# Patient Record
Sex: Male | Born: 1960 | Race: Black or African American | Hispanic: No | Smoking: Former smoker
Health system: Southern US, Community
[De-identification: ages and names within clinical notes are randomized; demographics above are authoritative.]

## PROBLEM LIST (undated history)

## (undated) DIAGNOSIS — R945 Abnormal results of liver function studies: Secondary | ICD-10-CM

## (undated) DIAGNOSIS — I10 Essential (primary) hypertension: Secondary | ICD-10-CM

## (undated) DIAGNOSIS — R3129 Other microscopic hematuria: Secondary | ICD-10-CM

## (undated) DIAGNOSIS — K760 Fatty (change of) liver, not elsewhere classified: Secondary | ICD-10-CM

## (undated) DIAGNOSIS — K219 Gastro-esophageal reflux disease without esophagitis: Secondary | ICD-10-CM

## (undated) DIAGNOSIS — E669 Obesity, unspecified: Secondary | ICD-10-CM

## (undated) DIAGNOSIS — E785 Hyperlipidemia, unspecified: Secondary | ICD-10-CM

## (undated) DIAGNOSIS — E119 Type 2 diabetes mellitus without complications: Secondary | ICD-10-CM

## (undated) HISTORY — DX: Essential (primary) hypertension: I10

## (undated) HISTORY — DX: Gastro-esophageal reflux disease without esophagitis: K21.9

## (undated) HISTORY — DX: Other microscopic hematuria: R31.29

## (undated) HISTORY — DX: Fatty (change of) liver, not elsewhere classified: K76.0

## (undated) HISTORY — DX: Hyperlipidemia, unspecified: E78.5

## (undated) HISTORY — PX: OTHER SURGICAL HISTORY: SHX169

## (undated) HISTORY — DX: Abnormal results of liver function studies: R94.5

## (undated) HISTORY — DX: Obesity, unspecified: E66.9

## (undated) HISTORY — DX: Type 2 diabetes mellitus without complications: E11.9

---

## 2004-07-28 ENCOUNTER — Encounter: Admission: RE | Admit: 2004-07-28 | Discharge: 2004-07-28 | Payer: Self-pay | Admitting: Internal Medicine

## 2005-09-08 ENCOUNTER — Encounter: Admission: RE | Admit: 2005-09-08 | Discharge: 2005-09-08 | Payer: Self-pay | Admitting: Internal Medicine

## 2006-08-20 ENCOUNTER — Encounter
Admission: RE | Admit: 2006-08-20 | Discharge: 2006-08-20 | Payer: Self-pay | Admitting: Physical Medicine and Rehabilitation

## 2007-01-10 DIAGNOSIS — R7989 Other specified abnormal findings of blood chemistry: Secondary | ICD-10-CM

## 2007-01-10 DIAGNOSIS — K760 Fatty (change of) liver, not elsewhere classified: Secondary | ICD-10-CM

## 2007-01-10 HISTORY — DX: Fatty (change of) liver, not elsewhere classified: K76.0

## 2007-01-10 HISTORY — DX: Other specified abnormal findings of blood chemistry: R79.89

## 2007-04-11 ENCOUNTER — Encounter: Admission: RE | Admit: 2007-04-11 | Discharge: 2007-04-11 | Payer: Self-pay | Admitting: Internal Medicine

## 2010-04-28 ENCOUNTER — Ambulatory Visit (HOSPITAL_COMMUNITY)
Admission: RE | Admit: 2010-04-28 | Discharge: 2010-04-28 | Disposition: A | Discharge status: Home / Self Care | Payer: 59 | Source: Ambulatory Visit | Attending: Gastroenterology | Admitting: Gastroenterology

## 2010-04-28 ENCOUNTER — Other Ambulatory Visit: Payer: Self-pay | Admitting: Gastroenterology

## 2010-04-28 DIAGNOSIS — E785 Hyperlipidemia, unspecified: Secondary | ICD-10-CM | POA: Insufficient documentation

## 2010-04-28 DIAGNOSIS — Z1211 Encounter for screening for malignant neoplasm of colon: Secondary | ICD-10-CM | POA: Insufficient documentation

## 2010-04-28 DIAGNOSIS — K219 Gastro-esophageal reflux disease without esophagitis: Secondary | ICD-10-CM | POA: Insufficient documentation

## 2010-04-28 DIAGNOSIS — D126 Benign neoplasm of colon, unspecified: Secondary | ICD-10-CM | POA: Insufficient documentation

## 2010-04-28 DIAGNOSIS — I10 Essential (primary) hypertension: Secondary | ICD-10-CM | POA: Insufficient documentation

## 2010-05-04 NOTE — Op Note (Signed)
  [name], [name] NO.:  [id number]  MEDICAL RECORD NO.:  [id number]           PATIENT TYPE:  O  LOCATION:  [facility]LEN                         FACILITY:  [facility]  PHYSICIAN:  Danise Edge, M.D.   DATE OF BIRTH:  Oct 18, 1960  DATE OF PROCEDURE:  04/28/2010 DATE OF DISCHARGE:                              OPERATIVE REPORT   PROCEDURE:  Screening colonoscopy.  REFERRING PHYSICIAN:  Georgann Housekeeper, MD.  HISTORY:  Mr. [name]. Far[place] is a 50 year old male born [birth year]. The patient is scheduled to undergo his first screening colonoscopy with polypectomy to prevent colon cancer.  ENDOSCOPIST:  Danise Edge, M.D.  PREMEDICATIONS:  Fentanyl 100 mcg and Versed 7.5 mg.  DESCRIPTION OF PROCEDURE:  After obtaining informed consent, the patient was placed in the left lateral decubitus position.  Anal inspection and digital rectal exam were normal.  The Pentax pediatric colonoscope was introduced into the rectum and easily advanced to the cecum.  A normal- appearing ileocecal valve and appendiceal orifice were identified.  Colonic preparation for the exam today was good except for a fair amount of residual solid stool staining the mucosa in the proximal ascending colon and cecum.  Small polyp could easily have been missed in these areas.  Rectum normal.  Retroflex view of the distal rectum normal.  Sigmoid colon and descending colon normal.  Splenic flexure normal.  Transverse colon normal.  Hepatic flexure normal.  Ascending colon:  From the proximal ascending colon, a 3-mm sessile polyp was removed with cold biopsy forceps.  Cecum and ileocecal valve normal, although solid stool was present in the cecum compromising visualization of the entire mucosa.  ASSESSMENT:  A small polyp was removed from the proximal ascending colon with the cold biopsy forceps in the colon; otherwise, normal screening colonoscopy.  Examination of the cecum and proximal  descending colon was compromised by the presence of residual solid stool.  RECOMMENDATIONS:  If the colon polyp returns neoplastic pathologically, the patient should undergo a surveillance colonoscopy in 5 years.  If the colon polyp is non-neoplastic pathologically, the patient should undergo a screening colonoscopy in 10 years.          ______________________________ Danise Edge, M.D.     MJ/MEDQ  D:  04/28/2010  T:  04/28/2010  Job:  [id number]  cc:   Georgann Housekeeper, MD Fax: [phone]  Electronically Signed by Danise Edge M.D. on 05/04/2010 12:07:19 PM

## 2013-06-24 DIAGNOSIS — K219 Gastro-esophageal reflux disease without esophagitis: Secondary | ICD-10-CM | POA: Insufficient documentation

## 2013-06-24 DIAGNOSIS — E139 Other specified diabetes mellitus without complications: Secondary | ICD-10-CM | POA: Insufficient documentation

## 2013-06-24 DIAGNOSIS — I10 Essential (primary) hypertension: Secondary | ICD-10-CM | POA: Insufficient documentation

## 2013-06-24 DIAGNOSIS — E669 Obesity, unspecified: Secondary | ICD-10-CM | POA: Insufficient documentation

## 2013-06-24 DIAGNOSIS — E785 Hyperlipidemia, unspecified: Secondary | ICD-10-CM | POA: Insufficient documentation

## 2015-08-22 ENCOUNTER — Emergency Department (HOSPITAL_COMMUNITY): Payer: No Typology Code available for payment source

## 2015-08-22 ENCOUNTER — Encounter (HOSPITAL_COMMUNITY): Payer: Self-pay | Admitting: Emergency Medicine

## 2015-08-22 ENCOUNTER — Inpatient Hospital Stay (HOSPITAL_COMMUNITY)
Admission: EM | Admit: 2015-08-22 | Discharge: 2015-08-27 | DRG: 958 | Disposition: A | Discharge status: Rehabilitation Facility | Payer: No Typology Code available for payment source | Attending: Surgery | Admitting: Surgery

## 2015-08-22 DIAGNOSIS — S066X0A Traumatic subarachnoid hemorrhage without loss of consciousness, initial encounter: Secondary | ICD-10-CM | POA: Diagnosis present

## 2015-08-22 DIAGNOSIS — Z7984 Long term (current) use of oral hypoglycemic drugs: Secondary | ICD-10-CM

## 2015-08-22 DIAGNOSIS — R402253 Coma scale, best verbal response, oriented, at hospital admission: Secondary | ICD-10-CM | POA: Diagnosis present

## 2015-08-22 DIAGNOSIS — I10 Essential (primary) hypertension: Secondary | ICD-10-CM | POA: Diagnosis not present

## 2015-08-22 DIAGNOSIS — E119 Type 2 diabetes mellitus without complications: Secondary | ICD-10-CM | POA: Diagnosis present

## 2015-08-22 DIAGNOSIS — S32692A Other specified fracture of left ischium, initial encounter for closed fracture: Secondary | ICD-10-CM | POA: Diagnosis present

## 2015-08-22 DIAGNOSIS — Z87891 Personal history of nicotine dependence: Secondary | ICD-10-CM | POA: Diagnosis not present

## 2015-08-22 DIAGNOSIS — S32462A Displaced associated transverse-posterior fracture of left acetabulum, initial encounter for closed fracture: Principal | ICD-10-CM | POA: Diagnosis present

## 2015-08-22 DIAGNOSIS — T07XXXA Unspecified multiple injuries, initial encounter: Secondary | ICD-10-CM

## 2015-08-22 DIAGNOSIS — K219 Gastro-esophageal reflux disease without esophagitis: Secondary | ICD-10-CM | POA: Diagnosis not present

## 2015-08-22 DIAGNOSIS — R402143 Coma scale, eyes open, spontaneous, at hospital admission: Secondary | ICD-10-CM | POA: Diagnosis present

## 2015-08-22 DIAGNOSIS — Z419 Encounter for procedure for purposes other than remedying health state, unspecified: Secondary | ICD-10-CM

## 2015-08-22 DIAGNOSIS — S069X9A Unspecified intracranial injury with loss of consciousness of unspecified duration, initial encounter: Secondary | ICD-10-CM

## 2015-08-22 DIAGNOSIS — Z8041 Family history of malignant neoplasm of ovary: Secondary | ICD-10-CM

## 2015-08-22 DIAGNOSIS — E876 Hypokalemia: Secondary | ICD-10-CM | POA: Diagnosis not present

## 2015-08-22 DIAGNOSIS — Z833 Family history of diabetes mellitus: Secondary | ICD-10-CM

## 2015-08-22 DIAGNOSIS — Z882 Allergy status to sulfonamides status: Secondary | ICD-10-CM

## 2015-08-22 DIAGNOSIS — K760 Fatty (change of) liver, not elsewhere classified: Secondary | ICD-10-CM | POA: Diagnosis present

## 2015-08-22 DIAGNOSIS — Z888 Allergy status to other drugs, medicaments and biological substances status: Secondary | ICD-10-CM

## 2015-08-22 DIAGNOSIS — Z794 Long term (current) use of insulin: Secondary | ICD-10-CM | POA: Diagnosis not present

## 2015-08-22 DIAGNOSIS — R7989 Other specified abnormal findings of blood chemistry: Secondary | ICD-10-CM | POA: Diagnosis not present

## 2015-08-22 DIAGNOSIS — Z8249 Family history of ischemic heart disease and other diseases of the circulatory system: Secondary | ICD-10-CM

## 2015-08-22 DIAGNOSIS — S066XAA Traumatic subarachnoid hemorrhage with loss of consciousness status unknown, initial encounter: Secondary | ICD-10-CM | POA: Diagnosis present

## 2015-08-22 DIAGNOSIS — R609 Edema, unspecified: Secondary | ICD-10-CM

## 2015-08-22 DIAGNOSIS — R918 Other nonspecific abnormal finding of lung field: Secondary | ICD-10-CM | POA: Diagnosis not present

## 2015-08-22 DIAGNOSIS — R0902 Hypoxemia: Secondary | ICD-10-CM | POA: Diagnosis not present

## 2015-08-22 DIAGNOSIS — I609 Nontraumatic subarachnoid hemorrhage, unspecified: Secondary | ICD-10-CM | POA: Diagnosis present

## 2015-08-22 DIAGNOSIS — Z6835 Body mass index (BMI) 35.0-35.9, adult: Secondary | ICD-10-CM | POA: Diagnosis not present

## 2015-08-22 DIAGNOSIS — S0081XA Abrasion of other part of head, initial encounter: Secondary | ICD-10-CM | POA: Diagnosis present

## 2015-08-22 DIAGNOSIS — Z79899 Other long term (current) drug therapy: Secondary | ICD-10-CM

## 2015-08-22 DIAGNOSIS — Z23 Encounter for immunization: Secondary | ICD-10-CM | POA: Diagnosis not present

## 2015-08-22 DIAGNOSIS — M5136 Other intervertebral disc degeneration, lumbar region: Secondary | ICD-10-CM | POA: Diagnosis not present

## 2015-08-22 DIAGNOSIS — Z885 Allergy status to narcotic agent status: Secondary | ICD-10-CM

## 2015-08-22 DIAGNOSIS — S066X9A Traumatic subarachnoid hemorrhage with loss of consciousness of unspecified duration, initial encounter: Secondary | ICD-10-CM | POA: Diagnosis present

## 2015-08-22 DIAGNOSIS — E669 Obesity, unspecified: Secondary | ICD-10-CM | POA: Diagnosis present

## 2015-08-22 DIAGNOSIS — G8921 Chronic pain due to trauma: Secondary | ICD-10-CM

## 2015-08-22 DIAGNOSIS — S60512A Abrasion of left hand, initial encounter: Secondary | ICD-10-CM | POA: Diagnosis present

## 2015-08-22 DIAGNOSIS — S51812A Laceration without foreign body of left forearm, initial encounter: Secondary | ICD-10-CM | POA: Diagnosis present

## 2015-08-22 DIAGNOSIS — E785 Hyperlipidemia, unspecified: Secondary | ICD-10-CM | POA: Diagnosis present

## 2015-08-22 DIAGNOSIS — S32402A Unspecified fracture of left acetabulum, initial encounter for closed fracture: Secondary | ICD-10-CM | POA: Diagnosis present

## 2015-08-22 DIAGNOSIS — I2510 Atherosclerotic heart disease of native coronary artery without angina pectoris: Secondary | ICD-10-CM | POA: Diagnosis not present

## 2015-08-22 DIAGNOSIS — R402363 Coma scale, best motor response, obeys commands, at hospital admission: Secondary | ICD-10-CM | POA: Diagnosis present

## 2015-08-22 DIAGNOSIS — R3129 Other microscopic hematuria: Secondary | ICD-10-CM | POA: Diagnosis not present

## 2015-08-22 DIAGNOSIS — S72002A Fracture of unspecified part of neck of left femur, initial encounter for closed fracture: Secondary | ICD-10-CM

## 2015-08-22 DIAGNOSIS — M79632 Pain in left forearm: Secondary | ICD-10-CM | POA: Diagnosis not present

## 2015-08-22 DIAGNOSIS — D62 Acute posthemorrhagic anemia: Secondary | ICD-10-CM | POA: Diagnosis not present

## 2015-08-22 DIAGNOSIS — S069XAA Unspecified intracranial injury with loss of consciousness status unknown, initial encounter: Secondary | ICD-10-CM

## 2015-08-22 LAB — CBC WITH DIFFERENTIAL/PLATELET
BASOS PCT: 0 %
Basophils Absolute: 0 10*3/uL (ref 0.0–0.1)
EOS ABS: 0.1 10*3/uL (ref 0.0–0.7)
EOS PCT: 1 %
HCT: 45 % (ref 39.0–52.0)
Hemoglobin: 15.4 g/dL (ref 13.0–17.0)
LYMPHS ABS: 2.4 10*3/uL (ref 0.7–4.0)
Lymphocytes Relative: 19 %
MCH: 30.3 pg (ref 26.0–34.0)
MCHC: 34.2 g/dL (ref 30.0–36.0)
MCV: 88.6 fL (ref 78.0–100.0)
MONOS PCT: 5 %
Monocytes Absolute: 0.6 10*3/uL (ref 0.1–1.0)
NEUTROS PCT: 75 %
Neutro Abs: 9.7 10*3/uL — ABNORMAL HIGH (ref 1.7–7.7)
PLATELETS: 220 10*3/uL (ref 150–400)
RBC: 5.08 MIL/uL (ref 4.22–5.81)
RDW: 13.9 % (ref 11.5–15.5)
WBC: 12.7 10*3/uL — ABNORMAL HIGH (ref 4.0–10.5)

## 2015-08-22 LAB — URINALYSIS, ROUTINE W REFLEX MICROSCOPIC
BILIRUBIN URINE: NEGATIVE
Glucose, UA: NEGATIVE mg/dL
KETONES UR: NEGATIVE mg/dL
Leukocytes, UA: NEGATIVE
Nitrite: NEGATIVE
Protein, ur: NEGATIVE mg/dL
SPECIFIC GRAVITY, URINE: 1.045 — AB (ref 1.005–1.030)
pH: 5 (ref 5.0–8.0)

## 2015-08-22 LAB — COMPREHENSIVE METABOLIC PANEL
ALBUMIN: 4 g/dL (ref 3.5–5.0)
ALT: 32 U/L (ref 17–63)
ANION GAP: 11 (ref 5–15)
AST: 51 U/L — ABNORMAL HIGH (ref 15–41)
Alkaline Phosphatase: 43 U/L (ref 38–126)
BUN: 16 mg/dL (ref 6–20)
CALCIUM: 9.2 mg/dL (ref 8.9–10.3)
CHLORIDE: 105 mmol/L (ref 101–111)
CO2: 23 mmol/L (ref 22–32)
Creatinine, Ser: 1.35 mg/dL — ABNORMAL HIGH (ref 0.61–1.24)
GFR calc non Af Amer: 57 mL/min — ABNORMAL LOW (ref >60)
GLUCOSE: 161 mg/dL — AB (ref 65–99)
POTASSIUM: 3.4 mmol/L — AB (ref 3.5–5.1)
SODIUM: 139 mmol/L (ref 135–145)
Total Bilirubin: 1.7 mg/dL — ABNORMAL HIGH (ref 0.3–1.2)
Total Protein: 6.2 g/dL — ABNORMAL LOW (ref 6.5–8.1)

## 2015-08-22 LAB — GLUCOSE, CAPILLARY
GLUCOSE-CAPILLARY: 163 mg/dL — AB (ref 65–99)
GLUCOSE-CAPILLARY: 127 mg/dL — AB (ref 65–99)
Glucose-Capillary: 133 mg/dL — ABNORMAL HIGH (ref 65–99)
Glucose-Capillary: 124 mg/dL — ABNORMAL HIGH (ref 65–99)

## 2015-08-22 LAB — MAGNESIUM: MAGNESIUM: 1.9 mg/dL (ref 1.7–2.4)

## 2015-08-22 LAB — URINE MICROSCOPIC-ADD ON

## 2015-08-22 LAB — PROTIME-INR
INR: 0.99
PROTHROMBIN TIME: 13.1 s (ref 11.4–15.2)

## 2015-08-22 LAB — I-STAT CG4 LACTIC ACID, ED: Lactic Acid, Venous: 3.01 mmol/L (ref 0.5–1.9)

## 2015-08-22 LAB — LIPASE, BLOOD: LIPASE: 23 U/L (ref 11–51)

## 2015-08-22 MED ORDER — INSULIN ASPART 100 UNIT/ML ~~LOC~~ SOLN
0.0000 [IU] | SUBCUTANEOUS | Status: DC
  Administered 2015-08-22: 2 [IU] via SUBCUTANEOUS
  Administered 2015-08-22: 3 [IU] via SUBCUTANEOUS
  Administered 2015-08-22 – 2015-08-24 (×5): 2 [IU] via SUBCUTANEOUS
  Administered 2015-08-25 (×2): 3 [IU] via SUBCUTANEOUS
  Administered 2015-08-25 (×3): 2 [IU] via SUBCUTANEOUS
  Administered 2015-08-25: 3 [IU] via SUBCUTANEOUS
  Administered 2015-08-26 (×5): 2 [IU] via SUBCUTANEOUS
  Administered 2015-08-26: 3 [IU] via SUBCUTANEOUS
  Administered 2015-08-26 – 2015-08-27 (×5): 2 [IU] via SUBCUTANEOUS

## 2015-08-22 MED ORDER — OXYCODONE HCL 5 MG PO TABS
10.0000 mg | ORAL_TABLET | ORAL | Status: DC | PRN
  Administered 2015-08-22 – 2015-08-23 (×6): 10 mg via ORAL
  Filled 2015-08-22 (×5): qty 2

## 2015-08-22 MED ORDER — AMLODIPINE BESYLATE 10 MG PO TABS
10.0000 mg | ORAL_TABLET | Freq: Every day | ORAL | Status: DC
  Administered 2015-08-22 – 2015-08-27 (×4): 10 mg via ORAL
  Filled 2015-08-22 (×5): qty 1

## 2015-08-22 MED ORDER — HYDROMORPHONE HCL 1 MG/ML IJ SOLN
1.0000 mg | INTRAMUSCULAR | Status: DC | PRN
  Administered 2015-08-22: 1 mg via INTRAVENOUS
  Filled 2015-08-22: qty 1

## 2015-08-22 MED ORDER — FENTANYL CITRATE (PF) 100 MCG/2ML IJ SOLN
100.0000 ug | Freq: Once | INTRAMUSCULAR | Status: AC
  Administered 2015-08-22: 100 ug via INTRAVENOUS
  Filled 2015-08-22: qty 2

## 2015-08-22 MED ORDER — ONDANSETRON HCL 4 MG PO TABS
4.0000 mg | ORAL_TABLET | Freq: Four times a day (QID) | ORAL | Status: DC | PRN
Start: 2015-08-22 — End: 2015-08-27

## 2015-08-22 MED ORDER — KCL IN DEXTROSE-NACL 20-5-0.9 MEQ/L-%-% IV SOLN
INTRAVENOUS | Status: DC
  Filled 2015-08-22 (×2): qty 1000

## 2015-08-22 MED ORDER — ATORVASTATIN CALCIUM 20 MG PO TABS
20.0000 mg | ORAL_TABLET | Freq: Every day | ORAL | Status: DC
  Administered 2015-08-22 – 2015-08-27 (×4): 20 mg via ORAL
  Filled 2015-08-22 (×4): qty 1

## 2015-08-22 MED ORDER — PANTOPRAZOLE SODIUM 40 MG PO TBEC
40.0000 mg | DELAYED_RELEASE_TABLET | Freq: Every day | ORAL | Status: DC
  Administered 2015-08-22 – 2015-08-27 (×4): 40 mg via ORAL
  Filled 2015-08-22 (×4): qty 1

## 2015-08-22 MED ORDER — HYDROCHLOROTHIAZIDE 25 MG PO TABS
25.0000 mg | ORAL_TABLET | Freq: Every day | ORAL | Status: DC
  Administered 2015-08-22 – 2015-08-27 (×3): 25 mg via ORAL
  Filled 2015-08-22 (×4): qty 1

## 2015-08-22 MED ORDER — METOCLOPRAMIDE HCL 10 MG PO TABS: 10.0000 mg | ORAL_TABLET | Freq: Once | ORAL | Status: DC

## 2015-08-22 MED ORDER — IOPAMIDOL (ISOVUE-300) INJECTION 61%
INTRAVENOUS | Status: AC
  Administered 2015-08-22: 100 mL
  Filled 2015-08-22: qty 100

## 2015-08-22 MED ORDER — ONDANSETRON HCL 4 MG/2ML IJ SOLN
4.0000 mg | Freq: Four times a day (QID) | INTRAMUSCULAR | Status: DC | PRN
  Administered 2015-08-22 – 2015-08-24 (×3): 4 mg via INTRAVENOUS
  Filled 2015-08-22 (×2): qty 2

## 2015-08-22 MED ORDER — TETANUS-DIPHTH-ACELL PERTUSSIS 5-2.5-18.5 LF-MCG/0.5 IM SUSP
0.5000 mL | Freq: Once | INTRAMUSCULAR | Status: AC
  Administered 2015-08-22: 0.5 mL via INTRAMUSCULAR
  Filled 2015-08-22: qty 0.5

## 2015-08-22 MED ORDER — WHITE PETROLATUM GEL
Status: AC
  Administered 2015-08-22: 15:00:00
  Filled 2015-08-22: qty 1

## 2015-08-22 MED ORDER — IRBESARTAN 300 MG PO TABS
300.0000 mg | ORAL_TABLET | Freq: Every day | ORAL | Status: DC
  Administered 2015-08-22 – 2015-08-27 (×4): 300 mg via ORAL
  Filled 2015-08-22 (×5): qty 1

## 2015-08-22 MED ORDER — SODIUM CHLORIDE 0.9 % IV BOLUS (SEPSIS)
2000.0000 mL | Freq: Once | INTRAVENOUS | Status: AC
  Administered 2015-08-22: 2000 mL via INTRAVENOUS

## 2015-08-22 NOTE — Progress Notes (Signed)
   08/22/15 0018  Clinical Encounter Type  Visited With Patient  Visit Type Trauma;ED  Referral From Nurse  Stress Factors  Family Stress Factors Lack of knowledge  Advance Directives (For Healthcare)  Does patient have an advance directive? No  Chaplain responded to [facility] trauma, called patient's daughter in [place] to ask her to come to the hospital on behalf of her father. Stone Spirito, Chaplain
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
## 2015-08-22 NOTE — Progress Notes (Signed)
Inpatient Rehabilitation  PT is recommending IP rehab.  At this time, we are recommending IP Rehab consult.  Please order if you are agreeable.  Thank you,  Gerlean Ren PT Inpatient Rehab Admissions Coordinator Cell [phone] Office [phone]
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
## 2015-08-22 NOTE — ED Notes (Signed)
Pt returned from CT/Xray. 

## 2015-08-22 NOTE — ED Notes (Signed)
Pt to CT
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
## 2015-08-22 NOTE — Progress Notes (Signed)
Patient ID: ORAS SPLITT, male   DOB: 1960-02-21, 55 y.o.   MRN: [id number]  Awake and alert Denies HA.  Reports only left hip pain Remains hemodynamically stable Lungs clear Abdomen soft, NT Neuro grossly intact  Plan: Continue current care

## 2015-08-23 ENCOUNTER — Inpatient Hospital Stay (HOSPITAL_COMMUNITY): Payer: No Typology Code available for payment source

## 2015-08-23 DIAGNOSIS — S32402A Unspecified fracture of left acetabulum, initial encounter for closed fracture: Secondary | ICD-10-CM | POA: Diagnosis present

## 2015-08-23 DIAGNOSIS — S066XAA Traumatic subarachnoid hemorrhage with loss of consciousness status unknown, initial encounter: Secondary | ICD-10-CM | POA: Diagnosis present

## 2015-08-23 DIAGNOSIS — S066X9A Traumatic subarachnoid hemorrhage with loss of consciousness of unspecified duration, initial encounter: Secondary | ICD-10-CM

## 2015-08-23 DIAGNOSIS — T07XXXA Unspecified multiple injuries, initial encounter: Secondary | ICD-10-CM

## 2015-08-23 DIAGNOSIS — S32402S Unspecified fracture of left acetabulum, sequela: Secondary | ICD-10-CM

## 2015-08-23 LAB — TYPE AND SCREEN
ABO/RH(D): O POS
ANTIBODY SCREEN: NEGATIVE

## 2015-08-23 LAB — CBC
HCT: 38.9 % — ABNORMAL LOW (ref 39.0–52.0)
HEMATOCRIT: 37.5 % — AB (ref 39.0–52.0)
Hemoglobin: 13 g/dL (ref 13.0–17.0)
Hemoglobin: 12.3 g/dL — ABNORMAL LOW (ref 13.0–17.0)
MCH: 30 pg (ref 26.0–34.0)
MCH: 29.1 pg (ref 26.0–34.0)
MCHC: 33.4 g/dL (ref 30.0–36.0)
MCHC: 32.8 g/dL (ref 30.0–36.0)
MCV: 89.6 fL (ref 78.0–100.0)
MCV: 88.9 fL (ref 78.0–100.0)
PLATELETS: 164 10*3/uL (ref 150–400)
PLATELETS: 160 10*3/uL (ref 150–400)
RBC: 4.34 MIL/uL (ref 4.22–5.81)
RBC: 4.22 MIL/uL (ref 4.22–5.81)
RDW: 14.3 % (ref 11.5–15.5)
RDW: 14.1 % (ref 11.5–15.5)
WBC: 7.8 10*3/uL (ref 4.0–10.5)
WBC: 7.5 10*3/uL (ref 4.0–10.5)

## 2015-08-23 LAB — HEMOGLOBIN A1C
Hgb A1c MFr Bld: 6.1 % — ABNORMAL HIGH (ref 4.8–5.6)
MEAN PLASMA GLUCOSE: 128 mg/dL

## 2015-08-23 LAB — POCT I-STAT, CHEM 8
BUN: 19 mg/dL (ref 6–20)
Calcium, Ion: 1.12 mmol/L — ABNORMAL LOW (ref 1.13–1.30)
Chloride: 104 mmol/L (ref 101–111)
Creatinine, Ser: 1.3 mg/dL — ABNORMAL HIGH (ref 0.61–1.24)
Glucose, Bld: 155 mg/dL — ABNORMAL HIGH (ref 65–99)
HEMATOCRIT: 47 % (ref 39.0–52.0)
HEMOGLOBIN: 16 g/dL (ref 13.0–17.0)
Potassium: 3.4 mmol/L — ABNORMAL LOW (ref 3.5–5.1)
SODIUM: 142 mmol/L (ref 135–145)
TCO2: 22 mmol/L (ref 0–100)

## 2015-08-23 LAB — GLUCOSE, CAPILLARY
GLUCOSE-CAPILLARY: 124 mg/dL — AB (ref 65–99)
GLUCOSE-CAPILLARY: 144 mg/dL — AB (ref 65–99)
GLUCOSE-CAPILLARY: 146 mg/dL — AB (ref 65–99)
Glucose-Capillary: 109 mg/dL — ABNORMAL HIGH (ref 65–99)
Glucose-Capillary: 136 mg/dL — ABNORMAL HIGH (ref 65–99)
Glucose-Capillary: 110 mg/dL — ABNORMAL HIGH (ref 65–99)

## 2015-08-23 LAB — LACTIC ACID, PLASMA: LACTIC ACID, VENOUS: 2.2 mmol/L — AB (ref 0.5–1.9)

## 2015-08-23 LAB — SURGICAL PCR SCREEN
MRSA, PCR: NEGATIVE
STAPHYLOCOCCUS AUREUS: NEGATIVE

## 2015-08-23 LAB — ABO/RH: ABO/RH(D): O POS

## 2015-08-23 LAB — PROTIME-INR
INR: 1.09
PROTHROMBIN TIME: 14.2 s (ref 11.4–15.2)

## 2015-08-23 MED ORDER — FENTANYL CITRATE (PF) 250 MCG/5ML IJ SOLN
INTRAMUSCULAR | Status: AC
  Filled 2015-08-23: qty 5

## 2015-08-23 MED ORDER — CEFAZOLIN SODIUM-DEXTROSE 2-4 GM/100ML-% IV SOLN
2.0000 g | Freq: Once | INTRAVENOUS | Status: DC
  Filled 2015-08-23: qty 100

## 2015-08-23 MED ORDER — KCL-LACTATED RINGERS 20 MEQ/L IV SOLN: INTRAVENOUS | Status: DC

## 2015-08-23 MED ORDER — CEFAZOLIN SODIUM-DEXTROSE 2-4 GM/100ML-% IV SOLN
2.0000 g | Freq: Once | INTRAVENOUS | Status: AC
  Administered 2015-08-24: 2 g via INTRAVENOUS
  Filled 2015-08-23: qty 100

## 2015-08-23 MED ORDER — HYDROMORPHONE HCL 1 MG/ML IJ SOLN
0.5000 mg | INTRAMUSCULAR | Status: DC | PRN
  Administered 2015-08-24 – 2015-08-25 (×2): 0.5 mg via INTRAVENOUS
  Filled 2015-08-23 (×2): qty 1

## 2015-08-23 MED ORDER — MIDAZOLAM HCL 2 MG/2ML IJ SOLN
INTRAMUSCULAR | Status: AC
  Filled 2015-08-23: qty 2

## 2015-08-23 MED ORDER — DOCUSATE SODIUM 100 MG PO CAPS
100.0000 mg | ORAL_CAPSULE | Freq: Two times a day (BID) | ORAL | Status: DC
  Administered 2015-08-23 – 2015-08-27 (×7): 100 mg via ORAL
  Filled 2015-08-23 (×7): qty 1

## 2015-08-23 MED ORDER — OXYCODONE HCL 5 MG PO TABS
5.0000 mg | ORAL_TABLET | ORAL | Status: DC | PRN
  Administered 2015-08-23 – 2015-08-24 (×6): 10 mg via ORAL
  Administered 2015-08-25 (×2): 15 mg via ORAL
  Filled 2015-08-23 (×4): qty 2
  Filled 2015-08-23 (×2): qty 3
  Filled 2015-08-23: qty 2

## 2015-08-23 MED ORDER — PROPOFOL 10 MG/ML IV BOLUS
INTRAVENOUS | Status: AC
  Filled 2015-08-23: qty 40

## 2015-08-23 MED ORDER — POLYETHYLENE GLYCOL 3350 17 G PO PACK
17.0000 g | PACK | Freq: Every day | ORAL | Status: DC
  Administered 2015-08-23 – 2015-08-27 (×4): 17 g via ORAL
  Filled 2015-08-23 (×4): qty 1

## 2015-08-23 MED ORDER — BACITRACIN ZINC 500 UNIT/GM EX OINT
TOPICAL_OINTMENT | Freq: Two times a day (BID) | CUTANEOUS | Status: DC
  Administered 2015-08-23 – 2015-08-27 (×8): via TOPICAL
  Filled 2015-08-23: qty 28.35
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
## 2015-08-23 NOTE — Progress Notes (Signed)
Physical medicine rehabilitation consult requested chart reviewed. Anticipate ORIF May 22, 202017 for left acetabular fracture. Plan to await surgical intervention and follow-up after physical and occupational therapy evaluations completed post surgery
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
## 2015-08-23 NOTE — Progress Notes (Signed)
CRITICAL VALUE ALERT  Critical value received:  Lactic acid 2.2  Date of notification:  8/14  Time of notification:  9:13  Critical value read back:Yes.    Nurse who received alert:  Ethelda Chick RN  MD notified (1st page):  Silvestre Gunner PA  Time of first page:  Notified him in person 9:15 am
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
## 2015-08-24 ENCOUNTER — Inpatient Hospital Stay (HOSPITAL_COMMUNITY): Payer: No Typology Code available for payment source

## 2015-08-24 ENCOUNTER — Encounter (HOSPITAL_COMMUNITY): Admission: EM | Disposition: A | Discharge status: Rehabilitation Facility | Payer: Self-pay | Source: Home / Self Care

## 2015-08-24 ENCOUNTER — Inpatient Hospital Stay (HOSPITAL_COMMUNITY): Payer: No Typology Code available for payment source | Admitting: Certified Registered Nurse Anesthetist

## 2015-08-24 ENCOUNTER — Encounter (HOSPITAL_COMMUNITY): Payer: Self-pay | Admitting: Certified Registered Nurse Anesthetist

## 2015-08-24 HISTORY — PX: ORIF ACETABULAR FRACTURE: SHX5029

## 2015-08-24 LAB — GLUCOSE, CAPILLARY
GLUCOSE-CAPILLARY: 108 mg/dL — AB (ref 65–99)
GLUCOSE-CAPILLARY: 132 mg/dL — AB (ref 65–99)
GLUCOSE-CAPILLARY: 122 mg/dL — AB (ref 65–99)
Glucose-Capillary: 120 mg/dL — ABNORMAL HIGH (ref 65–99)
Glucose-Capillary: 99 mg/dL (ref 65–99)

## 2015-08-24 LAB — BASIC METABOLIC PANEL
ANION GAP: 9 (ref 5–15)
BUN: 6 mg/dL (ref 6–20)
CHLORIDE: 100 mmol/L — AB (ref 101–111)
CO2: 27 mmol/L (ref 22–32)
Calcium: 8.4 mg/dL — ABNORMAL LOW (ref 8.9–10.3)
Creatinine, Ser: 0.92 mg/dL (ref 0.61–1.24)
GFR calc Af Amer: >60 mL/min (ref >60)
GLUCOSE: 146 mg/dL — AB (ref 65–99)
POTASSIUM: 3 mmol/L — AB (ref 3.5–5.1)
Sodium: 136 mmol/L (ref 135–145)

## 2015-08-24 LAB — CBC
HCT: 34.8 % — ABNORMAL LOW (ref 39.0–52.0)
HEMOGLOBIN: 11.5 g/dL — AB (ref 13.0–17.0)
MCH: 29.5 pg (ref 26.0–34.0)
MCHC: 33 g/dL (ref 30.0–36.0)
MCV: 89.2 fL (ref 78.0–100.0)
PLATELETS: 170 10*3/uL (ref 150–400)
RBC: 3.9 MIL/uL — AB (ref 4.22–5.81)
RDW: 14.2 % (ref 11.5–15.5)
WBC: 8.4 10*3/uL (ref 4.0–10.5)

## 2015-08-24 SURGERY — OPEN REDUCTION INTERNAL FIXATION (ORIF) ACETABULAR FRACTURE
Anesthesia: General | Laterality: Left

## 2015-08-24 MED ORDER — FENTANYL CITRATE (PF) 250 MCG/5ML IJ SOLN
INTRAMUSCULAR | Status: AC
  Filled 2015-08-24: qty 5

## 2015-08-24 MED ORDER — MIDAZOLAM HCL 5 MG/5ML IJ SOLN
INTRAMUSCULAR | Status: DC | PRN
  Administered 2015-08-24: 2 mg via INTRAVENOUS

## 2015-08-24 MED ORDER — HYDROMORPHONE HCL 1 MG/ML IJ SOLN
0.2500 mg | INTRAMUSCULAR | Status: DC | PRN
  Administered 2015-08-24 (×2): 0.5 mg via INTRAVENOUS

## 2015-08-24 MED ORDER — MIDAZOLAM HCL 2 MG/2ML IJ SOLN
INTRAMUSCULAR | Status: AC
  Filled 2015-08-24: qty 2

## 2015-08-24 MED ORDER — PHENYLEPHRINE 40 MCG/ML (10ML) SYRINGE FOR IV PUSH (FOR BLOOD PRESSURE SUPPORT)
PREFILLED_SYRINGE | INTRAVENOUS | Status: AC
  Filled 2015-08-24: qty 10

## 2015-08-24 MED ORDER — OXYCODONE HCL 5 MG PO TABS: 5.0000 mg | ORAL_TABLET | Freq: Once | ORAL | Status: DC | PRN

## 2015-08-24 MED ORDER — ALBUMIN HUMAN 5 % IV SOLN
INTRAVENOUS | Status: DC | PRN
  Administered 2015-08-24: 15:00:00 via INTRAVENOUS

## 2015-08-24 MED ORDER — EPHEDRINE SULFATE 50 MG/ML IJ SOLN
INTRAMUSCULAR | Status: AC
  Filled 2015-08-24: qty 1

## 2015-08-24 MED ORDER — ONDANSETRON HCL 4 MG/2ML IJ SOLN
INTRAMUSCULAR | Status: AC
  Filled 2015-08-24: qty 2

## 2015-08-24 MED ORDER — LIDOCAINE HCL (CARDIAC) 20 MG/ML IV SOLN
INTRAVENOUS | Status: DC | PRN
  Administered 2015-08-24: 100 mg via INTRAVENOUS

## 2015-08-24 MED ORDER — PHENYLEPHRINE HCL 10 MG/ML IJ SOLN
INTRAMUSCULAR | Status: DC | PRN
  Administered 2015-08-24 (×6): 80 ug via INTRAVENOUS
  Administered 2015-08-24: 120 ug via INTRAVENOUS

## 2015-08-24 MED ORDER — LACTATED RINGERS IV SOLN
INTRAVENOUS | Status: DC | PRN
  Administered 2015-08-24 (×3): via INTRAVENOUS

## 2015-08-24 MED ORDER — FENTANYL CITRATE (PF) 100 MCG/2ML IJ SOLN
INTRAMUSCULAR | Status: DC | PRN
  Administered 2015-08-24 (×6): 50 ug via INTRAVENOUS

## 2015-08-24 MED ORDER — ROCURONIUM BROMIDE 100 MG/10ML IV SOLN
INTRAVENOUS | Status: DC | PRN
  Administered 2015-08-24: 20 mg via INTRAVENOUS
  Administered 2015-08-24: 15 mg via INTRAVENOUS
  Administered 2015-08-24: 40 mg via INTRAVENOUS
  Administered 2015-08-24: 20 mg via INTRAVENOUS
  Administered 2015-08-24: 25 mg via INTRAVENOUS
  Administered 2015-08-24: 20 mg via INTRAVENOUS

## 2015-08-24 MED ORDER — DIPHENHYDRAMINE HCL 50 MG/ML IJ SOLN
INTRAMUSCULAR | Status: DC | PRN
  Administered 2015-08-24: 12.5 mg via INTRAVENOUS

## 2015-08-24 MED ORDER — 0.9 % SODIUM CHLORIDE (POUR BTL) OPTIME
TOPICAL | Status: DC | PRN
  Administered 2015-08-24: 1000 mL

## 2015-08-24 MED ORDER — HYDROMORPHONE HCL 1 MG/ML IJ SOLN
INTRAMUSCULAR | Status: AC
  Filled 2015-08-24: qty 1

## 2015-08-24 MED ORDER — PROPOFOL 10 MG/ML IV BOLUS
INTRAVENOUS | Status: DC | PRN
  Administered 2015-08-24: 180 mg via INTRAVENOUS

## 2015-08-24 MED ORDER — SODIUM CHLORIDE 0.9 % IR SOLN
Status: DC | PRN
  Administered 2015-08-24: 1000 mL

## 2015-08-24 MED ORDER — SUGAMMADEX SODIUM 200 MG/2ML IV SOLN
INTRAVENOUS | Status: AC
  Filled 2015-08-24: qty 2

## 2015-08-24 MED ORDER — PROPOFOL 10 MG/ML IV BOLUS
INTRAVENOUS | Status: AC
  Filled 2015-08-24: qty 20

## 2015-08-24 MED ORDER — DIPHENHYDRAMINE HCL 50 MG/ML IJ SOLN
INTRAMUSCULAR | Status: AC
  Filled 2015-08-24: qty 1

## 2015-08-24 MED ORDER — KCL IN DEXTROSE-NACL 20-5-0.45 MEQ/L-%-% IV SOLN
INTRAVENOUS | Status: AC
  Filled 2015-08-24: qty 1000

## 2015-08-24 MED ORDER — CEFAZOLIN SODIUM-DEXTROSE 2-4 GM/100ML-% IV SOLN
2.0000 g | Freq: Three times a day (TID) | INTRAVENOUS | Status: AC
  Administered 2015-08-24 – 2015-08-25 (×3): 2 g via INTRAVENOUS
  Filled 2015-08-24 (×3): qty 100

## 2015-08-24 MED ORDER — PHENYLEPHRINE HCL 10 MG/ML IJ SOLN
INTRAVENOUS | Status: DC | PRN
  Administered 2015-08-24: 20 ug/min via INTRAVENOUS

## 2015-08-24 MED ORDER — SUGAMMADEX SODIUM 200 MG/2ML IV SOLN
INTRAVENOUS | Status: DC | PRN
  Administered 2015-08-24: 200 mg via INTRAVENOUS

## 2015-08-24 MED ORDER — OXYCODONE HCL 5 MG/5ML PO SOLN: 5.0000 mg | Freq: Once | ORAL | Status: DC | PRN

## 2015-08-24 MED ORDER — PROMETHAZINE HCL 25 MG/ML IJ SOLN: 6.2500 mg | INTRAMUSCULAR | Status: DC | PRN

## 2015-08-24 MED ORDER — LACTATED RINGERS IV SOLN
INTRAVENOUS | Status: DC
  Administered 2015-08-24: 50 mL/h via INTRAVENOUS
  Administered 2015-08-25: 01:00:00 via INTRAVENOUS

## 2015-08-24 SURGICAL SUPPLY — 77 items
BIT DRILL 5/64X5 DISP (BIT) ×2 IMPLANT
BIT DRILL AO MATTA 2.5MX230M (BIT) IMPLANT
BIT DRILL STEP 3.5 (DRILL) ×1 IMPLANT
BIT DRILL TWST MATTA 3.5MX195M (BIT) IMPLANT
BLADE SURG ROTATE 9660 (MISCELLANEOUS) IMPLANT
BNDG COHESIVE 4X5 TAN STRL (GAUZE/BANDAGES/DRESSINGS) ×4 IMPLANT
BONE CANC CHIPS 20CC PCAN1/4 (Bone Implant) ×3 IMPLANT
BRUSH SCRUB DISP (MISCELLANEOUS) ×6 IMPLANT
CHIPS CANC BONE 20CC PCAN1/4 (Bone Implant) ×1 IMPLANT
CLOSURE WOUND 1/2 X4 (GAUZE/BANDAGES/DRESSINGS) ×1
COVER SURGICAL LIGHT HANDLE (MISCELLANEOUS) ×3 IMPLANT
DRAPE C-ARM 42X72 X-RAY (DRAPES) ×3 IMPLANT
DRAPE C-ARMOR (DRAPES) ×3 IMPLANT
DRAPE INCISE IOBAN 85X60 (DRAPES) ×6 IMPLANT
DRAPE ORTHO SPLIT 77X108 STRL (DRAPES) ×6
DRAPE SURG ORHT 6 SPLT 77X108 (DRAPES) ×2 IMPLANT
DRAPE U-SHAPE 47X51 STRL (DRAPES) ×3 IMPLANT
DRILL BIT AO MATTA 2.5MX230M (BIT) ×3
DRILL STEP 3.5 (DRILL) ×3
DRILL TWIST AO MATTA 3.5MX195M (BIT) ×3
DRSG MEPILEX BORDER 4X12 (GAUZE/BANDAGES/DRESSINGS) ×2 IMPLANT
ELECT BLADE 6.5 EXT (BLADE) IMPLANT
ELECT REM PT RETURN 9FT ADLT (ELECTROSURGICAL) ×3
ELECTRODE REM PT RTRN 9FT ADLT (ELECTROSURGICAL) ×1 IMPLANT
GAUZE SPONGE 4X4 16PLY XRAY LF (GAUZE/BANDAGES/DRESSINGS) IMPLANT
GLOVE BIO SURGEON STRL SZ7.5 (GLOVE) ×3 IMPLANT
GLOVE BIO SURGEON STRL SZ8 (GLOVE) ×3 IMPLANT
GLOVE BIOGEL PI IND STRL 7.5 (GLOVE) ×1 IMPLANT
GLOVE BIOGEL PI IND STRL 8 (GLOVE) ×1 IMPLANT
GLOVE BIOGEL PI INDICATOR 7.5 (GLOVE) ×2
GLOVE BIOGEL PI INDICATOR 8 (GLOVE) ×2
GOWN STRL REUS W/ TWL LRG LVL3 (GOWN DISPOSABLE) ×2 IMPLANT
GOWN STRL REUS W/ TWL XL LVL3 (GOWN DISPOSABLE) ×2 IMPLANT
GOWN STRL REUS W/TWL LRG LVL3 (GOWN DISPOSABLE) ×6
GOWN STRL REUS W/TWL XL LVL3 (GOWN DISPOSABLE) ×6
GRAFT BNE CANC CHIPS 1-8 20CC (Bone Implant) IMPLANT
HANDPIECE INTERPULSE COAX TIP (DISPOSABLE) ×3
KIT BASIN OR (CUSTOM PROCEDURE TRAY) ×3 IMPLANT
KIT ROOM TURNOVER OR (KITS) ×3 IMPLANT
MANIFOLD NEPTUNE II (INSTRUMENTS) ×3 IMPLANT
NDL SUT 6 .5 CRC .975X.05 MAYO (NEEDLE) ×1 IMPLANT
NEEDLE MAYO TAPER (NEEDLE) ×3
NS IRRIG 1000ML POUR BTL (IV SOLUTION) ×3 IMPLANT
PACK TOTAL JOINT (CUSTOM PROCEDURE TRAY) ×3 IMPLANT
PAD ARMBOARD 7.5X6 YLW CONV (MISCELLANEOUS) ×6 IMPLANT
PIN APEX 6X180MM EXFIX (EXFIX) ×2 IMPLANT
PLATE ACET STRT 94.5M 8H (Plate) ×4 IMPLANT
RETRACTOR YANK SUCT EIGR SABER (INSTRUMENTS) ×2 IMPLANT
RETRIEVER SUT HEWSON (MISCELLANEOUS) ×1 IMPLANT
SCREW CORTEX ST MATTA 3.5X24 (Screw) ×2 IMPLANT
SCREW CORTEX ST MATTA 3.5X26MM (Screw) ×6 IMPLANT
SCREW CORTEX ST MATTA 3.5X28MM (Screw) ×4 IMPLANT
SCREW CORTEX ST MATTA 3.5X30MM (Screw) ×2 IMPLANT
SCREW CORTEX ST MATTA 3.5X32MM (Screw) ×2 IMPLANT
SCREW CORTEX ST MATTA 3.5X38M (Screw) ×2 IMPLANT
SCREW CORTEX ST MATTA 3.5X45MM (Screw) ×2 IMPLANT
SCREW CORTEX ST MATTA 3.5X80MM (Screw) ×2 IMPLANT
SET HNDPC FAN SPRY TIP SCT (DISPOSABLE) ×1 IMPLANT
STAPLER VISISTAT 35W (STAPLE) ×3 IMPLANT
STRIP CLOSURE SKIN 1/2X4 (GAUZE/BANDAGES/DRESSINGS) ×2 IMPLANT
SUCTION FRAZIER HANDLE 10FR (MISCELLANEOUS) ×2
SUCTION TUBE FRAZIER 10FR DISP (MISCELLANEOUS) ×1 IMPLANT
SUT ETHILON 2 0 FS 18 (SUTURE) ×2 IMPLANT
SUT ETHILON 2 0 PSLX (SUTURE) ×6 IMPLANT
SUT FIBERWIRE #2 38 T-5 BLUE (SUTURE) ×6
SUT VIC AB 0 CT1 27 (SUTURE) ×3
SUT VIC AB 0 CT1 27XBRD ANBCTR (SUTURE) ×1 IMPLANT
SUT VIC AB 1 CT1 18XCR BRD 8 (SUTURE) ×1 IMPLANT
SUT VIC AB 1 CT1 27 (SUTURE) ×3
SUT VIC AB 1 CT1 27XBRD ANBCTR (SUTURE) IMPLANT
SUT VIC AB 1 CT1 8-18 (SUTURE) ×6
SUT VIC AB 2-0 CT1 27 (SUTURE) ×6
SUT VIC AB 2-0 CT1 TAPERPNT 27 (SUTURE) ×1 IMPLANT
SUTURE FIBERWR #2 38 T-5 BLUE (SUTURE) ×2 IMPLANT
TOWEL OR 17X24 6PK STRL BLUE (TOWEL DISPOSABLE) ×5 IMPLANT
TOWEL OR 17X26 10 PK STRL BLUE (TOWEL DISPOSABLE) ×6 IMPLANT
TRAY FOLEY CATH 16FRSI W/METER (SET/KITS/TRAYS/PACK) ×2 IMPLANT

## 2015-08-24 NOTE — Transfer of Care (Signed)
Immediate Anesthesia Transfer of Care Note  Patient: [name]  Procedure(s) Performed: Procedure(s): OPEN REDUCTION INTERNAL FIXATION (ORIF) ACETABULAR FRACTURE (Left)  Patient Location: PACU  Anesthesia Type:General  Level of Consciousness: awake and patient cooperative  Airway & Oxygen Therapy: Patient Spontanous Breathing and Patient connected to face mask oxygen  Post-op Assessment: Report given to RN and Post -op Vital signs reviewed and stable  Post vital signs: Reviewed and stable  Last Vitals:  Vitals:   08/24/15 1020 08/24/15 1603  BP: (!) 141/82 126/73  Pulse: 77 94  Resp: 20 12  Temp: 36.8 C 36.9 C    Last Pain:  Vitals:   08/24/15 1603  TempSrc:   PainSc: Asleep      Patients Stated Pain Goal: 3 ([id number])  Complications: No apparent anesthesia complications
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
## 2015-08-24 NOTE — Anesthesia Preprocedure Evaluation (Signed)
Anesthesia Evaluation  Patient identified by MRN, date of birth, ID band Patient awake    Reviewed: Allergy & Precautions, NPO status , Patient's Chart, lab work & pertinent test results  Airway Mallampati: II  TM Distance: >3 FB Neck ROM: Full    Dental no notable dental hx.    Pulmonary neg pulmonary ROS, former smoker,    Pulmonary exam normal        Cardiovascular hypertension, Normal cardiovascular exam     Neuro/Psych negative neurological ROS     GI/Hepatic Neg liver ROS, GERD  ,  Endo/Other  diabetes, Type 2  Renal/GU negative Renal ROS     Musculoskeletal negative musculoskeletal ROS (+)   Abdominal   Peds  Hematology negative hematology ROS (+)   Anesthesia Other Findings Day of surgery medications reviewed with the patient.  Reproductive/Obstetrics                             Anesthesia Physical Anesthesia Plan  ASA: III  Anesthesia Plan: General   Post-op Pain Management:    Induction: Intravenous  Airway Management Planned: Oral ETT  Additional Equipment: None  Intra-op Plan:   Post-operative Plan: Extubation in OR  Informed Consent: I have reviewed the patients History and Physical, chart, labs and discussed the procedure including the risks, benefits and alternatives for the proposed anesthesia with the patient or authorized representative who has indicated his/her understanding and acceptance.   Dental advisory given  Plan Discussed with:   Anesthesia Plan Comments:         Anesthesia Quick Evaluation

## 2015-08-24 NOTE — Progress Notes (Signed)
PT Cancellation Note  Patient Details Name: [name] MRN: [id number] DOB: [birth year]   Cancelled Treatment:    Reason Eval/Treat Not Completed: Patient at procedure or test/unavailable Pt in OR. Will follow up as time allows.   Marguarite Arbour A Jenel Gierke 08/24/2015, 12:10 PM Wray Kearns, [place], DPT [phone]

## 2015-08-24 NOTE — Progress Notes (Signed)
Trauma Service Note  Subjective: Patient doing fine.  Awaiting surgery today.    Objective: Vital signs in last 24 hours: Temp:  [98.1 F (36.7 C)-98.3 F (36.8 C)] 98.3 F (36.8 C) (08/15 0450) Pulse Rate:  [82-97] 93 (08/15 0450) Resp:  [18] 18 (08/15 0450) BP: (117-132)/(73-82) 128/82 (08/15 0450) SpO2:  [96 %-98 %] 96 % (08/15 0450) Last BM Date: 07/21/15  Intake/Output from previous day: 08/14 0701 - 08/15 0700 In: 600 [P.O.:600] Out: -  Intake/Output this shift: No intake/output data recorded.  General: No acute distress  Lungs: Clear  Abd: Soft, benign  Extremities: Abrasion on left hand with swelling.  No fractures on X-ray.  Neuro: Intact  Lab Results: CBC   Recent Labs  08/23/15 0833 08/23/15 2329  WBC 7.8 7.5  HGB 13.0 12.3*  HCT 38.9* 37.5*  PLT 164 160   BMET  Recent Labs  08/22/15 0037 08/22/15 0051  NA 139 142  K 3.4* 3.4*  CL 105 104  CO2 23  --   GLUCOSE 161* 155*  BUN 16 19  CREATININE 1.35* 1.30*  CALCIUM 9.2  --    PT/INR  Recent Labs  08/22/15 0037 08/23/15 0833  LABPROT 13.1 14.2  INR 0.99 1.09   ABG No results for input(s): PHART, HCO3 in the last 72 hours.  Invalid input(s): PCO2, PO2  Studies/Results: Ct Head Wo Contrast  Result Date: 2020/05/1115 CLINICAL DATA:  Followup traumatic brain injury.  No new symptoms. EXAM: CT HEAD WITHOUT CONTRAST TECHNIQUE: Contiguous axial images were obtained from the base of the skull through the vertex without intravenous contrast. COMPARISON:  08/22/2015. FINDINGS: Resolving subarachnoid blood, most notably decreased layering in the RIGHT sylvian fissure. No parenchymal hemorrhage or new areas of subarachnoid blood. Slight increase in bifrontal extra-axial CSF like collections, consistent with increasing hygromas. These measure up to 6 mm bilaterally. No significant mass effect on the underlying brain but continued surveillance warranted. No hydrocephalus, or midline shift. No  calvarial fracture. No layering sinus or mastoid fluid. Negative orbits. In the LEFT frontal scalp, a metallic or high density foreign body remains imbedded within an area soft tissue swelling, approximately 4 mm thickness. IMPRESSION: Improving subarachnoid blood. Slight increase in bifrontal extra-axial CSF like collection consistent with worsening hygromas. Continued surveillance is warranted. Persistent imbedded metallic or high density foreign body in the LEFT frontal scalp. Electronically Signed   By: Staci Righter M.D.   On: 02020/05/1115 15:53   Dg Femur Port, Min 2 Views Right  Result Date: 2020/05/1115 CLINICAL DATA:  Mid right 5 pain and swelling. 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 EXAM: RIGHT FEMUR PORTABLE 1 VIEW COMPARISON:  None. FINDINGS: Minimal right hip and minimal right knee degenerative changes. Otherwise, normal appearing bones and soft tissues. IMPRESSION: Minimal right hip and right knee degenerative changes. No acute abnormality. Electronically Signed   By: Claudie Revering M.D.   On: 02020/05/1115 13:33    Anti-infectives: Anti-infectives    Start     Dose/Rate Route Frequency Ordered Stop   08/24/15 1200  ceFAZolin (ANCEF) IVPB 2g/100 mL premix     2 g 200 mL/hr over 30 Minutes Intravenous  Once 08/23/15 1400     08/23/15 1230  ceFAZolin (ANCEF) IVPB 2g/100 mL premix  Status:  Discontinued     2 g 200 mL/hr over 30 Minutes Intravenous  Once 08/23/15 0942 08/23/15 1400      Assessment/Plan: s/p Procedure(s): OPEN REDUCTION  INTERNAL FIXATION (ORIF) ACETABULAR FRACTURE Surgery today.  LOS: 2 days   Kathryne Eriksson. Dahlia Bailiff, MD, FACS [phone] Trauma Surgeon 08/24/2015

## 2015-08-24 NOTE — Anesthesia Procedure Notes (Signed)
Procedure Name: Intubation Date/Time: 08/24/2015 11:58 AM Performed by: Rejeana Brock L Pre-anesthesia Checklist: Patient identified, Emergency Drugs available, Suction available and Patient being monitored Patient Re-evaluated:Patient Re-evaluated prior to inductionOxygen Delivery Method: Circle System Utilized Preoxygenation: Pre-oxygenation with 100% oxygen Intubation Type: IV induction Ventilation: Mask ventilation without difficulty Laryngoscope Size: Mac and 4 Grade View: Grade I Tube type: Oral Tube size: 7.5 mm Number of attempts: 1 Airway Equipment and Method: Stylet and Oral airway Placement Confirmation: ETT inserted through vocal cords under direct vision,  positive ETCO2 and breath sounds checked- equal and bilateral Secured at: 22 cm Tube secured with: Tape Dental Injury: Teeth and Oropharynx as per pre-operative assessment

## 2015-08-24 NOTE — Brief Op Note (Addendum)
08/22/2015 - 08/24/2015  3:42 PM  PATIENT:  [name]  55 y.o. male  PRE-OPERATIVE DIAGNOSIS:  LEFT ACETABULAR FX COMMINUTED TRANSVERSE POSTERIOR WALL  POST-OPERATIVE DIAGNOSIS:  LEFT ACETABULAR FX COMMINUTED TRANSVERSE POSTERIOR WALL  PROCEDURE:  Procedure(s): OPEN REDUCTION INTERNAL FIXATION (ORIF) ACETABULAR FRACTURE (Left), COMMINUTED TRANSVERSE POSTERIOR WALL  SURGEON:  Surgeon(s) and Role:    * Altamese Olney, MD - Primary  PHYSICIAN ASSISTANT: Ainsley Spinner, PAC  ANESTHESIA:   general  EBL:  Total I/O In: 2250 [I.V.:2000; IV Piggyback:250] Out: 1200 [Urine:800; Blood:400]  BLOOD ADMINISTERED:none  DRAINS: none   LOCAL MEDICATIONS USED:  NONE  SPECIMEN:  No Specimen  DISPOSITION OF SPECIMEN:  N/A  COUNTS:  YES  TOURNIQUET:  * No tourniquets in log *  DICTATION: .Other Dictation: Dictation Number [id number]  PLAN OF CARE: Admit to inpatient   PATIENT DISPOSITION:  PACU - hemodynamically stable.   Delay start of Pharmacological VTE agent (>24hrs) due to surgical blood loss or risk of bleeding: no

## 2015-08-24 NOTE — Progress Notes (Signed)
OT Cancellation Note  Patient Details Name: [name] MRN: [id number] DOB: 03/10/1960   Cancelled Treatment:    Reason Eval/Treat Not Completed: Patient at procedure or test/ unavailable. Pt gone to OR. Will re attempt next appropriate/available time  Britt Bottom 08/24/2015, 10:50 AM

## 2015-08-25 ENCOUNTER — Encounter (HOSPITAL_COMMUNITY): Payer: Self-pay | Admitting: Orthopedic Surgery

## 2015-08-25 ENCOUNTER — Telehealth: Payer: Self-pay | Admitting: *Deleted

## 2015-08-25 ENCOUNTER — Ambulatory Visit
Admit: 2015-08-25 | Discharge: 2015-08-25 | Disposition: A | Discharge status: Home / Self Care | Payer: No Typology Code available for payment source | Attending: Radiation Oncology | Admitting: Radiation Oncology

## 2015-08-25 ENCOUNTER — Other Ambulatory Visit: Payer: Self-pay | Admitting: Radiation Oncology

## 2015-08-25 DIAGNOSIS — R918 Other nonspecific abnormal finding of lung field: Secondary | ICD-10-CM | POA: Insufficient documentation

## 2015-08-25 DIAGNOSIS — R3129 Other microscopic hematuria: Secondary | ICD-10-CM | POA: Insufficient documentation

## 2015-08-25 DIAGNOSIS — M5136 Other intervertebral disc degeneration, lumbar region: Secondary | ICD-10-CM | POA: Insufficient documentation

## 2015-08-25 DIAGNOSIS — Z87891 Personal history of nicotine dependence: Secondary | ICD-10-CM | POA: Insufficient documentation

## 2015-08-25 DIAGNOSIS — S32402A Unspecified fracture of left acetabulum, initial encounter for closed fracture: Secondary | ICD-10-CM | POA: Diagnosis not present

## 2015-08-25 DIAGNOSIS — D62 Acute posthemorrhagic anemia: Secondary | ICD-10-CM | POA: Diagnosis not present

## 2015-08-25 DIAGNOSIS — Z794 Long term (current) use of insulin: Secondary | ICD-10-CM | POA: Insufficient documentation

## 2015-08-25 DIAGNOSIS — E119 Type 2 diabetes mellitus without complications: Secondary | ICD-10-CM | POA: Insufficient documentation

## 2015-08-25 DIAGNOSIS — E785 Hyperlipidemia, unspecified: Secondary | ICD-10-CM | POA: Insufficient documentation

## 2015-08-25 DIAGNOSIS — I2510 Atherosclerotic heart disease of native coronary artery without angina pectoris: Secondary | ICD-10-CM | POA: Insufficient documentation

## 2015-08-25 DIAGNOSIS — M898X9 Other specified disorders of bone, unspecified site: Secondary | ICD-10-CM

## 2015-08-25 DIAGNOSIS — I10 Essential (primary) hypertension: Secondary | ICD-10-CM | POA: Insufficient documentation

## 2015-08-25 DIAGNOSIS — K219 Gastro-esophageal reflux disease without esophagitis: Secondary | ICD-10-CM | POA: Insufficient documentation

## 2015-08-25 DIAGNOSIS — S32402S Unspecified fracture of left acetabulum, sequela: Secondary | ICD-10-CM

## 2015-08-25 DIAGNOSIS — M79632 Pain in left forearm: Secondary | ICD-10-CM | POA: Insufficient documentation

## 2015-08-25 DIAGNOSIS — R7989 Other specified abnormal findings of blood chemistry: Secondary | ICD-10-CM | POA: Insufficient documentation

## 2015-08-25 DIAGNOSIS — I609 Nontraumatic subarachnoid hemorrhage, unspecified: Secondary | ICD-10-CM | POA: Insufficient documentation

## 2015-08-25 DIAGNOSIS — E669 Obesity, unspecified: Secondary | ICD-10-CM | POA: Insufficient documentation

## 2015-08-25 LAB — BASIC METABOLIC PANEL
Anion gap: 6 (ref 5–15)
BUN: 7 mg/dL (ref 6–20)
CALCIUM: 8.4 mg/dL — AB (ref 8.9–10.3)
CO2: 29 mmol/L (ref 22–32)
CREATININE: 0.95 mg/dL (ref 0.61–1.24)
Chloride: 101 mmol/L (ref 101–111)
GFR calc non Af Amer: >60 mL/min (ref >60)
Glucose, Bld: 128 mg/dL — ABNORMAL HIGH (ref 65–99)
Potassium: 3.1 mmol/L — ABNORMAL LOW (ref 3.5–5.1)
SODIUM: 136 mmol/L (ref 135–145)

## 2015-08-25 LAB — CBC
HCT: 31.8 % — ABNORMAL LOW (ref 39.0–52.0)
Hemoglobin: 10.8 g/dL — ABNORMAL LOW (ref 13.0–17.0)
MCH: 30.3 pg (ref 26.0–34.0)
MCHC: 34 g/dL (ref 30.0–36.0)
MCV: 89.1 fL (ref 78.0–100.0)
PLATELETS: 153 10*3/uL (ref 150–400)
RBC: 3.57 MIL/uL — AB (ref 4.22–5.81)
RDW: 13.9 % (ref 11.5–15.5)
WBC: 8.2 10*3/uL (ref 4.0–10.5)

## 2015-08-25 LAB — GLUCOSE, CAPILLARY
GLUCOSE-CAPILLARY: 138 mg/dL — AB (ref 65–99)
GLUCOSE-CAPILLARY: 143 mg/dL — AB (ref 65–99)
GLUCOSE-CAPILLARY: 153 mg/dL — AB (ref 65–99)
GLUCOSE-CAPILLARY: 172 mg/dL — AB (ref 65–99)
Glucose-Capillary: 128 mg/dL — ABNORMAL HIGH (ref 65–99)
Glucose-Capillary: 153 mg/dL — ABNORMAL HIGH (ref 65–99)

## 2015-08-25 MED ORDER — ENOXAPARIN SODIUM 30 MG/0.3ML ~~LOC~~ SOLN
30.0000 mg | Freq: Two times a day (BID) | SUBCUTANEOUS | Status: DC
  Administered 2015-08-25 – 2015-08-27 (×5): 30 mg via SUBCUTANEOUS
  Filled 2015-08-25 (×5): qty 0.3

## 2015-08-25 MED ORDER — OXYCODONE-ACETAMINOPHEN 5-325 MG PO TABS: 1.0000 | ORAL_TABLET | Freq: Four times a day (QID) | ORAL | Status: DC | PRN

## 2015-08-25 MED ORDER — ACETAMINOPHEN 325 MG PO TABS
650.0000 mg | ORAL_TABLET | ORAL | Status: DC | PRN
  Administered 2015-08-25 – 2015-08-26 (×5): 650 mg via ORAL
  Filled 2015-08-25 (×5): qty 2

## 2015-08-25 MED ORDER — POTASSIUM CHLORIDE CRYS ER 20 MEQ PO TBCR
20.0000 meq | EXTENDED_RELEASE_TABLET | Freq: Two times a day (BID) | ORAL | Status: DC
  Administered 2015-08-25 – 2015-08-27 (×5): 20 meq via ORAL
  Filled 2015-08-25 (×5): qty 1

## 2015-08-25 MED ORDER — METFORMIN HCL 500 MG PO TABS
500.0000 mg | ORAL_TABLET | Freq: Two times a day (BID) | ORAL | Status: DC
  Administered 2015-08-25 – 2015-08-27 (×6): 500 mg via ORAL
  Filled 2015-08-25 (×6): qty 1

## 2015-08-25 MED ORDER — OXYCODONE HCL 5 MG PO TABS
10.0000 mg | ORAL_TABLET | ORAL | Status: DC | PRN
  Administered 2015-08-25 – 2015-08-26 (×6): 20 mg via ORAL
  Administered 2015-08-26: 15 mg via ORAL
  Administered 2015-08-26: 20 mg via ORAL
  Administered 2015-08-27: 10 mg via ORAL
  Administered 2015-08-27 (×2): 20 mg via ORAL
  Filled 2015-08-25 (×5): qty 4
  Filled 2015-08-25: qty 3
  Filled 2015-08-25 (×5): qty 4
  Filled 2015-08-25: qty 2

## 2015-08-25 NOTE — Progress Notes (Signed)
   Pre-Radiation Note:  Inpatient nurse name: Corie, RN  Time Called: 1205pm Inpatient nurse to call [facility]: Yes.    [facility] called to verify transportation: Yes.   Time:1215pm  Patient Status:   Pain: Left Hip Pain medication given:  Mobility Orders: bed transfer Treatment Site: Left Hip Additional Injuries: facial skin abrasion seen right fface  Consent:    Is patient able to sign consent: Yes.   Family member called: No. Name/Time:     Rebecca Eaton, RN 08/25/2015,3:11 PM

## 2015-08-25 NOTE — Telephone Encounter (Signed)
Called [facility] [phone] spoke with Marden Noble to have [facility] come pick up patient 3:59 PM

## 2015-08-25 NOTE — Progress Notes (Signed)
   Pre-Radiation Transport Note:  IP nurse called:Corie, RN [place]  Time:  08/25/2015 12:00pm IV location:  RAC heplock IV fluids: No. * Last dose of pain medication given (name/time):  OXIR 2pmm  Rebecca Eaton, RN 08/25/2015,3:08 PM

## 2015-08-25 NOTE — Progress Notes (Signed)
Orthopaedic Trauma Service (OTS)  Subjective: 1 Day Post-Op Procedure(s) (LRB): OPEN REDUCTION INTERNAL FIXATION (ORIF) ACETABULAR FRACTURE (Left) Patient reports pain as severe.   Unable to sleep last night. Abduction pillow constricting.  Objective: Current Vitals Blood pressure 121/75, pulse (!) 103, temperature 100 F (37.8 C), temperature source Oral, resp. rate 16, height 5\' 6"  (1.676 m), weight 99.8 kg (220 lb), SpO2 99 %. Vital signs in last 24 hours: Temp:  [98.1 F (36.7 C)-100.2 F (37.9 C)] 100 F (37.8 C) (08/16 0457) Pulse Rate:  [77-106] 103 (08/16 0457) Resp:  [12-23] 16 (08/16 0457) BP: (103-141)/(57-82) 121/75 (08/16 0457) SpO2:  [93 %-100 %] 99 % (08/16 0457)  Intake/Output from previous day: 08/15 0701 - 08/16 0700 In: 3500 [I.V.:3050; IV Piggyback:450] Out: 2225 [Urine:1825; Blood:400]  LABS  Recent Labs  08/23/15 0833 08/23/15 2329 08/24/15 1620 08/25/15 0712  HGB 13.0 12.3* 11.5* 10.8*    Recent Labs  08/24/15 1620 08/25/15 0712  WBC 8.4 8.2  RBC 3.90* 3.57*  HCT 34.8* 31.8*  PLT 170 153    Recent Labs  08/24/15 1620  NA 136  K 3.0*  CL 100*  CO2 27  BUN 6  CREATININE 0.92  GLUCOSE 146*  CALCIUM 8.4*    Recent Labs  08/23/15 0833  INR 1.09    Physical Exam  CTA RRR LLE Dressing intact, clean, dry  Edema/ swelling controlled  Sens: DPN, SPN, TN intact  Motor: EHL, FHL, and lessor toe ext and flex all intact grossly  Brisk cap refill, warm to touch, DP 2+  Imaging Ct Head Wo Contrast  Result Date: September 15, 202017 CLINICAL DATA:  Followup traumatic brain injury.  No new symptoms. EXAM: CT HEAD WITHOUT CONTRAST TECHNIQUE: Contiguous axial images were obtained from the base of the skull through the vertex without intravenous contrast. COMPARISON:  08/22/2015. FINDINGS: Resolving subarachnoid blood, most notably decreased layering in the RIGHT sylvian fissure. No parenchymal hemorrhage or new areas of subarachnoid blood.  Slight increase in bifrontal extra-axial CSF like collections, consistent with increasing hygromas. These measure up to 6 mm bilaterally. No significant mass effect on the underlying brain but continued surveillance warranted. No hydrocephalus, or midline shift. No calvarial fracture. No layering sinus or mastoid fluid. Negative orbits. In the LEFT frontal scalp, a metallic or high density foreign body remains imbedded within an area soft tissue swelling, approximately 4 mm thickness. IMPRESSION: Improving subarachnoid blood. Slight increase in bifrontal extra-axial CSF like collection consistent with worsening hygromas. Continued surveillance is warranted. Persistent imbedded metallic or high density foreign body in the LEFT frontal scalp. Electronically Signed   By: Staci Righter M.D.   On: 0September 15, 202017 15:53   Dg Pelvis Comp Min 3v  Result Date: 08/24/2015 CLINICAL DATA:  ORIF for acetabular fracture. EXAM: JUDET PELVIS - 3+ VIEW COMPARISON:  08/24/2015, earlier the same day. FINDINGS: Three views study shows the patient to be status post ORIF with plate and screw fixation of anterior posterior column fractures of the left acetabulum. No evidence for immediate hardware complications on the provided images. IMPRESSION: ORIF for left acetabular fracture without evidence for immediate complicating features. Electronically Signed   By: Misty Stanley M.D.   On: 08/24/2015 17:19   Dg Pelvis 3v Judet  Result Date: 08/24/2015 CLINICAL DATA:  Fracture repair. FLUOROSCOPY TIME:  15 seconds. Images: 7 EXAM: JUDET PELVIS - 3+ VIEW; DG C-ARM GT 120 MIN COMPARISON:  CT scan August 22, 2015 FINDINGS: By the end of the study, 2 plates have  been affixed in the lead acetabular region with 1 of the plates extending into the inferior sacroiliac region. IMPRESSION: Left acetabular and inferior SI joint repair as above. Electronically Signed   By: Dorise Bullion III M.D   On: 08/24/2015 15:47   Dg C-arm Gt 10 Min  Result  Date: 08/24/2015 CLINICAL DATA:  Fracture repair. FLUOROSCOPY TIME:  15 seconds. Images: 7 EXAM: JUDET PELVIS - 3+ VIEW; DG C-ARM GT 120 MIN COMPARISON:  CT scan August 22, 2015 FINDINGS: By the end of the study, 2 plates have been affixed in the lead acetabular region with 1 of the plates extending into the inferior sacroiliac region. IMPRESSION: Left acetabular and inferior SI joint repair as above. Electronically Signed   By: Dorise Bullion III M.D   On: 08/24/2015 15:47   Dg Femur Port, Min 2 Views Right  Result Date: 2020/09/1515 CLINICAL DATA:  Mid right 5 pain and swelling. 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 EXAM: RIGHT FEMUR PORTABLE 1 VIEW COMPARISON:  None. FINDINGS: Minimal right hip and minimal right knee degenerative changes. Otherwise, normal appearing bones and soft tissues. IMPRESSION: Minimal right hip and right knee degenerative changes. No acute abnormality. Electronically Signed   By: Claudie Revering M.D.   On: 02020/09/1515 13:33    Assessment/Plan: 1 Day Post-Op Procedure(s) (LRB): OPEN REDUCTION INTERNAL FIXATION (ORIF) ACETABULAR FRACTURE (Left) Hypokalemia  1. XRT today or tomorrow; tomorrow would probably be better given current pain level  2. PT/ OT w post hip precaution teaching; TTWB; ok to d/c abduction pillow once restrictions understood 3. D/c planning 4. Today's chemistry still pending  Altamese Dolores, MD [facility] [phone] (p)  08/25/2015, 8:32 AM

## 2015-08-25 NOTE — Telephone Encounter (Signed)
Called [facility]  Add on today for 3pm heterotopic left hip radiation  treatment today,spokw with his nurse Corie,RN, they will call [facility] to trasnport patient here by 3pm, will medicate him for pain, she stated"he may need more pain med there at Radiation', thanked RN, called [facility] also 12:17 PM
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
## 2015-08-25 NOTE — Progress Notes (Signed)
   Post Radiation Note:  Report called to Inpatient RN: Corie RN Time: 08/25/2015,3:54 PM  Was radiation completed: Yes.    Were any PRN medications given: No. Name and time:  Were there any additional events: patient tolerated well, no meds given, called [facility] to come transport patient back  Rebecca Eaton, RN 08/25/2015,3:54 PM
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
## 2015-08-25 NOTE — Addendum Note (Signed)
Encounter addended by: Kyung Rudd, MD on: 08/25/2015  6:11 PM<BR>    Actions taken: Problem List reviewed, Edit attestation on clinical note
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
## 2015-08-25 NOTE — Progress Notes (Signed)
I met with pt and his daughter at bedside to discuss a possible inpt rehab admission. Both are in agreement. Noted plans for XRT. Pt is in agreement to admit to inpt rehab and bed is available. Noted possible plans to admit tomorrow. Karene Fry will follow up in my absence tomorrow. She can be reached at [phone]. Please call me today with any questions. [phone]

## 2015-08-26 LAB — GLUCOSE, CAPILLARY
GLUCOSE-CAPILLARY: 123 mg/dL — AB (ref 65–99)
GLUCOSE-CAPILLARY: 123 mg/dL — AB (ref 65–99)
GLUCOSE-CAPILLARY: 123 mg/dL — AB (ref 65–99)
GLUCOSE-CAPILLARY: 128 mg/dL — AB (ref 65–99)
Glucose-Capillary: 161 mg/dL — ABNORMAL HIGH (ref 65–99)
Glucose-Capillary: 144 mg/dL — ABNORMAL HIGH (ref 65–99)

## 2015-08-26 LAB — CBC
HEMATOCRIT: 32.1 % — AB (ref 39.0–52.0)
Hemoglobin: 10.4 g/dL — ABNORMAL LOW (ref 13.0–17.0)
MCH: 29 pg (ref 26.0–34.0)
MCHC: 32.4 g/dL (ref 30.0–36.0)
MCV: 89.4 fL (ref 78.0–100.0)
Platelets: 181 10*3/uL (ref 150–400)
RBC: 3.59 MIL/uL — ABNORMAL LOW (ref 4.22–5.81)
RDW: 13.9 % (ref 11.5–15.5)
WBC: 9.8 10*3/uL (ref 4.0–10.5)

## 2015-08-26 LAB — BASIC METABOLIC PANEL
ANION GAP: 10 (ref 5–15)
BUN: 13 mg/dL (ref 6–20)
CALCIUM: 8.7 mg/dL — AB (ref 8.9–10.3)
CO2: 28 mmol/L (ref 22–32)
Chloride: 97 mmol/L — ABNORMAL LOW (ref 101–111)
Creatinine, Ser: 1.1 mg/dL (ref 0.61–1.24)
GFR calc Af Amer: >60 mL/min (ref >60)
GFR calc non Af Amer: >60 mL/min (ref >60)
GLUCOSE: 117 mg/dL — AB (ref 65–99)
POTASSIUM: 3.5 mmol/L (ref 3.5–5.1)
Sodium: 135 mmol/L (ref 135–145)

## 2015-08-26 MED ORDER — WARFARIN - PHARMACIST DOSING INPATIENT
Freq: Every day | Status: DC
  Administered 2015-08-26: 18:00:00

## 2015-08-26 MED ORDER — DEXTROSE-NACL 5-0.9 % IV SOLN
INTRAVENOUS | Status: DC
Start: 2015-08-26 — End: 2015-08-27
  Administered 2015-08-26 – 2015-08-27 (×2): via INTRAVENOUS

## 2015-08-26 MED ORDER — WARFARIN SODIUM 5 MG PO TABS
5.0000 mg | ORAL_TABLET | Freq: Once | ORAL | Status: AC
  Administered 2015-08-26: 5 mg via ORAL
  Filled 2015-08-26: qty 1

## 2015-08-26 MED ORDER — COUMADIN BOOK
Freq: Once | Status: AC
  Administered 2015-08-26: 13:00:00
  Filled 2015-08-26: qty 1
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
## 2015-08-26 NOTE — Progress Notes (Signed)
NT notified RN of BPs in 80s/40-50s. Manual BP 88/38, pt states he "feels groggy and sleepy," but denies lightheadedness or dizziness. Pt's daughter states that his urine has been amber colored and stronger smelling than yesterday. Trauma PA notified, and received order to restart IV fluids at 75 cc/hour. Will continue to monitor pt.   [name], [name]
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
## 2015-08-27 ENCOUNTER — Encounter (HOSPITAL_COMMUNITY): Payer: Self-pay

## 2015-08-27 ENCOUNTER — Inpatient Hospital Stay (HOSPITAL_COMMUNITY)
Admission: RE | Admit: 2015-08-27 | Discharge: 2015-09-04 | DRG: 093 | Disposition: A | Discharge status: Home Health | Payer: No Typology Code available for payment source | Source: Intra-hospital | Attending: Physical Medicine & Rehabilitation | Admitting: Physical Medicine & Rehabilitation

## 2015-08-27 DIAGNOSIS — I10 Essential (primary) hypertension: Secondary | ICD-10-CM | POA: Diagnosis present

## 2015-08-27 DIAGNOSIS — K5903 Drug induced constipation: Secondary | ICD-10-CM | POA: Diagnosis present

## 2015-08-27 DIAGNOSIS — S32462D Displaced associated transverse-posterior fracture of left acetabulum, subsequent encounter for fracture with routine healing: Secondary | ICD-10-CM | POA: Diagnosis not present

## 2015-08-27 DIAGNOSIS — I959 Hypotension, unspecified: Secondary | ICD-10-CM | POA: Diagnosis present

## 2015-08-27 DIAGNOSIS — S32402S Unspecified fracture of left acetabulum, sequela: Secondary | ICD-10-CM

## 2015-08-27 DIAGNOSIS — Z79899 Other long term (current) drug therapy: Secondary | ICD-10-CM

## 2015-08-27 DIAGNOSIS — E119 Type 2 diabetes mellitus without complications: Secondary | ICD-10-CM | POA: Diagnosis present

## 2015-08-27 DIAGNOSIS — R911 Solitary pulmonary nodule: Secondary | ICD-10-CM | POA: Diagnosis present

## 2015-08-27 DIAGNOSIS — Z881 Allergy status to other antibiotic agents status: Secondary | ICD-10-CM

## 2015-08-27 DIAGNOSIS — Z886 Allergy status to analgesic agent status: Secondary | ICD-10-CM | POA: Diagnosis not present

## 2015-08-27 DIAGNOSIS — E669 Obesity, unspecified: Secondary | ICD-10-CM | POA: Diagnosis present

## 2015-08-27 DIAGNOSIS — Z87891 Personal history of nicotine dependence: Secondary | ICD-10-CM | POA: Diagnosis not present

## 2015-08-27 DIAGNOSIS — R269 Unspecified abnormalities of gait and mobility: Principal | ICD-10-CM | POA: Diagnosis present

## 2015-08-27 DIAGNOSIS — K219 Gastro-esophageal reflux disease without esophagitis: Secondary | ICD-10-CM | POA: Diagnosis present

## 2015-08-27 DIAGNOSIS — Z885 Allergy status to narcotic agent status: Secondary | ICD-10-CM

## 2015-08-27 DIAGNOSIS — E785 Hyperlipidemia, unspecified: Secondary | ICD-10-CM | POA: Diagnosis present

## 2015-08-27 DIAGNOSIS — S069X0A Unspecified intracranial injury without loss of consciousness, initial encounter: Secondary | ICD-10-CM

## 2015-08-27 DIAGNOSIS — K760 Fatty (change of) liver, not elsewhere classified: Secondary | ICD-10-CM | POA: Diagnosis present

## 2015-08-27 DIAGNOSIS — E876 Hypokalemia: Secondary | ICD-10-CM | POA: Diagnosis present

## 2015-08-27 DIAGNOSIS — R4189 Other symptoms and signs involving cognitive functions and awareness: Secondary | ICD-10-CM | POA: Diagnosis present

## 2015-08-27 DIAGNOSIS — S329XXA Fracture of unspecified parts of lumbosacral spine and pelvis, initial encounter for closed fracture: Secondary | ICD-10-CM | POA: Insufficient documentation

## 2015-08-27 DIAGNOSIS — S066X0D Traumatic subarachnoid hemorrhage without loss of consciousness, subsequent encounter: Secondary | ICD-10-CM | POA: Diagnosis not present

## 2015-08-27 DIAGNOSIS — Z7984 Long term (current) use of oral hypoglycemic drugs: Secondary | ICD-10-CM

## 2015-08-27 DIAGNOSIS — S069X0S Unspecified intracranial injury without loss of consciousness, sequela: Secondary | ICD-10-CM

## 2015-08-27 DIAGNOSIS — S32402A Unspecified fracture of left acetabulum, initial encounter for closed fracture: Secondary | ICD-10-CM | POA: Diagnosis present

## 2015-08-27 LAB — GLUCOSE, CAPILLARY
GLUCOSE-CAPILLARY: 120 mg/dL — AB (ref 65–99)
GLUCOSE-CAPILLARY: 121 mg/dL — AB (ref 65–99)
GLUCOSE-CAPILLARY: 128 mg/dL — AB (ref 65–99)
GLUCOSE-CAPILLARY: 129 mg/dL — AB (ref 65–99)
Glucose-Capillary: 148 mg/dL — ABNORMAL HIGH (ref 65–99)
Glucose-Capillary: 120 mg/dL — ABNORMAL HIGH (ref 65–99)

## 2015-08-27 LAB — PROTIME-INR
INR: 1.17
PROTHROMBIN TIME: 15 s (ref 11.4–15.2)

## 2015-08-27 LAB — CBC
HCT: 29.3 % — ABNORMAL LOW (ref 39.0–52.0)
Hemoglobin: 9.6 g/dL — ABNORMAL LOW (ref 13.0–17.0)
MCH: 29.1 pg (ref 26.0–34.0)
MCHC: 32.8 g/dL (ref 30.0–36.0)
MCV: 88.8 fL (ref 78.0–100.0)
PLATELETS: 194 10*3/uL (ref 150–400)
RBC: 3.3 MIL/uL — AB (ref 4.22–5.81)
RDW: 13.8 % (ref 11.5–15.5)
WBC: 7.5 10*3/uL (ref 4.0–10.5)

## 2015-08-27 LAB — BASIC METABOLIC PANEL
Anion gap: 8 (ref 5–15)
BUN: 13 mg/dL (ref 6–20)
CHLORIDE: 98 mmol/L — AB (ref 101–111)
CO2: 30 mmol/L (ref 22–32)
Calcium: 8.3 mg/dL — ABNORMAL LOW (ref 8.9–10.3)
Creatinine, Ser: 0.88 mg/dL (ref 0.61–1.24)
GFR calc non Af Amer: >60 mL/min (ref >60)
Glucose, Bld: 131 mg/dL — ABNORMAL HIGH (ref 65–99)
POTASSIUM: 3.2 mmol/L — AB (ref 3.5–5.1)
SODIUM: 136 mmol/L (ref 135–145)

## 2015-08-27 MED ORDER — GUAIFENESIN-DM 100-10 MG/5ML PO SYRP: 5.0000 mL | ORAL_SOLUTION | Freq: Four times a day (QID) | ORAL | Status: DC | PRN

## 2015-08-27 MED ORDER — IRBESARTAN 300 MG PO TABS
300.0000 mg | ORAL_TABLET | Freq: Every day | ORAL | Status: DC
Start: 2015-08-28 — End: 2015-09-04
  Administered 2015-09-02 – 2015-09-04 (×3): 300 mg via ORAL
  Filled 2015-08-27 (×8): qty 1

## 2015-08-27 MED ORDER — BISACODYL 10 MG RE SUPP
10.0000 mg | Freq: Every day | RECTAL | Status: DC | PRN
  Administered 2015-08-29: 10 mg via RECTAL
  Filled 2015-08-27 (×2): qty 1

## 2015-08-27 MED ORDER — WARFARIN SODIUM 7.5 MG PO TABS
7.5000 mg | ORAL_TABLET | Freq: Once | ORAL | Status: AC
Start: 2015-08-27 — End: 2015-08-27
  Administered 2015-08-27: 7.5 mg via ORAL
  Filled 2015-08-27: qty 1

## 2015-08-27 MED ORDER — DIPHENHYDRAMINE HCL 12.5 MG/5ML PO ELIX: 12.5000 mg | ORAL_SOLUTION | Freq: Four times a day (QID) | ORAL | Status: DC | PRN

## 2015-08-27 MED ORDER — TRAMADOL HCL 50 MG PO TABS
50.0000 mg | ORAL_TABLET | Freq: Three times a day (TID) | ORAL | Status: DC
  Administered 2015-08-27 – 2015-09-04 (×29): 50 mg via ORAL
  Filled 2015-08-27 (×30): qty 1

## 2015-08-27 MED ORDER — PANTOPRAZOLE SODIUM 40 MG PO TBEC
40.0000 mg | DELAYED_RELEASE_TABLET | Freq: Every day | ORAL | Status: DC
  Administered 2015-08-28 – 2015-09-04 (×8): 40 mg via ORAL
  Filled 2015-08-27 (×8): qty 1

## 2015-08-27 MED ORDER — ACETAMINOPHEN 325 MG PO TABS
325.0000 mg | ORAL_TABLET | ORAL | Status: DC | PRN
  Administered 2015-09-01: 650 mg via ORAL
  Filled 2015-08-27: qty 2

## 2015-08-27 MED ORDER — AMLODIPINE BESYLATE 10 MG PO TABS
10.0000 mg | ORAL_TABLET | Freq: Every day | ORAL | Status: DC
  Administered 2015-09-02 – 2015-09-04 (×3): 10 mg via ORAL
  Filled 2015-08-27 (×8): qty 1

## 2015-08-27 MED ORDER — PROCHLORPERAZINE EDISYLATE 5 MG/ML IJ SOLN: 5.0000 mg | Freq: Four times a day (QID) | INTRAMUSCULAR | Status: DC | PRN

## 2015-08-27 MED ORDER — SORBITOL 70 % SOLN
30.0000 mL | Freq: Two times a day (BID) | Status: DC | PRN
  Administered 2015-08-27: 30 mL via ORAL
  Filled 2015-08-27: qty 30

## 2015-08-27 MED ORDER — ONDANSETRON HCL 4 MG/2ML IJ SOLN: 4.0000 mg | Freq: Four times a day (QID) | INTRAMUSCULAR | Status: DC | PRN

## 2015-08-27 MED ORDER — ATORVASTATIN CALCIUM 20 MG PO TABS
20.0000 mg | ORAL_TABLET | Freq: Every day | ORAL | Status: DC
  Administered 2015-08-28 – 2015-09-04 (×8): 20 mg via ORAL
  Filled 2015-08-27 (×8): qty 1

## 2015-08-27 MED ORDER — PROCHLORPERAZINE MALEATE 5 MG PO TABS: 5.0000 mg | ORAL_TABLET | Freq: Four times a day (QID) | ORAL | Status: DC | PRN

## 2015-08-27 MED ORDER — HYDROCHLOROTHIAZIDE 25 MG PO TABS
25.0000 mg | ORAL_TABLET | Freq: Every day | ORAL | Status: DC
  Administered 2015-09-02 – 2015-09-04 (×3): 25 mg via ORAL
  Filled 2015-08-27 (×8): qty 1

## 2015-08-27 MED ORDER — FLEET ENEMA 7-19 GM/118ML RE ENEM: 1.0000 | ENEMA | Freq: Once | RECTAL | Status: DC | PRN

## 2015-08-27 MED ORDER — PROCHLORPERAZINE 25 MG RE SUPP: 12.5000 mg | Freq: Four times a day (QID) | RECTAL | Status: DC | PRN

## 2015-08-27 MED ORDER — OXYCODONE HCL ER 10 MG PO T12A
10.0000 mg | EXTENDED_RELEASE_TABLET | Freq: Two times a day (BID) | ORAL | Status: DC
  Administered 2015-08-27 – 2015-09-04 (×16): 10 mg via ORAL
  Filled 2015-08-27 (×16): qty 1

## 2015-08-27 MED ORDER — ONDANSETRON HCL 4 MG PO TABS: 4.0000 mg | ORAL_TABLET | Freq: Four times a day (QID) | ORAL | Status: DC | PRN

## 2015-08-27 MED ORDER — METFORMIN HCL 500 MG PO TABS
500.0000 mg | ORAL_TABLET | Freq: Two times a day (BID) | ORAL | Status: DC
  Administered 2015-08-28 – 2015-09-04 (×15): 500 mg via ORAL
  Filled 2015-08-27 (×15): qty 1

## 2015-08-27 MED ORDER — POLYSACCHARIDE IRON COMPLEX 150 MG PO CAPS
150.0000 mg | ORAL_CAPSULE | Freq: Two times a day (BID) | ORAL | Status: DC
  Administered 2015-08-28 – 2015-09-03 (×14): 150 mg via ORAL
  Filled 2015-08-27 (×15): qty 1

## 2015-08-27 MED ORDER — ENOXAPARIN SODIUM 30 MG/0.3ML ~~LOC~~ SOLN
30.0000 mg | Freq: Two times a day (BID) | SUBCUTANEOUS | Status: DC
  Administered 2015-08-27 – 2015-08-30 (×7): 30 mg via SUBCUTANEOUS
  Filled 2015-08-27 (×7): qty 0.3

## 2015-08-27 MED ORDER — POLYETHYLENE GLYCOL 3350 17 G PO PACK
17.0000 g | PACK | Freq: Two times a day (BID) | ORAL | Status: DC
  Administered 2015-08-28 – 2015-09-02 (×6): 17 g via ORAL
  Filled 2015-08-27 (×16): qty 1

## 2015-08-27 MED ORDER — METHOCARBAMOL 500 MG PO TABS: 500.0000 mg | ORAL_TABLET | Freq: Four times a day (QID) | ORAL | Status: DC | PRN

## 2015-08-27 MED ORDER — BACITRACIN ZINC 500 UNIT/GM EX OINT
TOPICAL_OINTMENT | Freq: Two times a day (BID) | CUTANEOUS | Status: DC
  Administered 2015-08-27 – 2015-08-28 (×3): via TOPICAL
  Administered 2015-08-29: 1 via TOPICAL
  Administered 2015-08-29 – 2015-08-30 (×3): via TOPICAL
  Administered 2015-08-31: 1 via TOPICAL
  Administered 2015-08-31 – 2015-09-04 (×8): via TOPICAL
  Filled 2015-08-27 (×2): qty 28.35

## 2015-08-27 MED ORDER — WARFARIN - PHARMACIST DOSING INPATIENT
Freq: Every day | Status: DC
  Administered 2015-08-28 – 2015-08-30 (×3)

## 2015-08-27 MED ORDER — ALUM & MAG HYDROXIDE-SIMETH 200-200-20 MG/5ML PO SUSP: 30.0000 mL | ORAL | Status: DC | PRN

## 2015-08-27 MED ORDER — POTASSIUM CHLORIDE CRYS ER 20 MEQ PO TBCR
40.0000 meq | EXTENDED_RELEASE_TABLET | Freq: Three times a day (TID) | ORAL | Status: AC
  Administered 2015-08-27 – 2015-08-28 (×2): 40 meq via ORAL
  Filled 2015-08-27 (×2): qty 2

## 2015-08-27 MED ORDER — TRAZODONE HCL 50 MG PO TABS: 25.0000 mg | ORAL_TABLET | Freq: Every evening | ORAL | Status: DC | PRN

## 2015-08-27 MED ORDER — OXYCODONE HCL 5 MG PO TABS
10.0000 mg | ORAL_TABLET | ORAL | Status: DC | PRN
  Administered 2015-08-27 – 2015-08-31 (×6): 10 mg via ORAL
  Filled 2015-08-27 (×7): qty 2
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
## 2015-08-27 NOTE — Progress Notes (Signed)
Patient arrived from 5N with RN and family. Patient oriented to room, rehab process, rehab schedule, fall prevention plan, and  rehab safety plan with verbal understanding. Patient resting comfortably in bed with daughter at the bedside.
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
## 2015-08-27 NOTE — Progress Notes (Signed)
Rehab admissions - Patient now medically clear for acute inpatient rehab admission.  Bed available and will admit to inpatient rehab today.  Call me for questions.  [phone]
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
## 2015-08-27 NOTE — Anesthesia Postprocedure Evaluation (Signed)
Anesthesia Post Note  Patient: [name]  Procedure(s) Performed: Procedure(s) (LRB): OPEN REDUCTION INTERNAL FIXATION (ORIF) ACETABULAR FRACTURE (Left)  Patient location during evaluation: PACU Anesthesia Type: General Level of consciousness: awake and alert Pain management: pain level controlled Vital Signs Assessment: post-procedure vital signs reviewed and stable Respiratory status: spontaneous breathing, nonlabored ventilation, respiratory function stable and patient connected to nasal cannula oxygen Cardiovascular status: blood pressure returned to baseline and stable Postop Assessment: no signs of nausea or vomiting Anesthetic complications: no    Last Vitals:  Vitals:   08/27/15 0615 08/27/15 1440  BP: (!) 103/54 (!) 113/52  Pulse: 88 (!) 104  Resp:  16  Temp: 36.7 C 37.1 C    Last Pain:  Vitals:   08/27/15 1440  TempSrc: Oral  PainSc:                  Delanee Xin,JAMES TERRILL

## 2015-08-27 NOTE — Progress Notes (Signed)
Call out to answering service for lax order. Pt scheduled to transfer to In-patient rehab today but needs lax prior to d/c.
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
## 2015-08-28 ENCOUNTER — Inpatient Hospital Stay (HOSPITAL_COMMUNITY): Payer: No Typology Code available for payment source | Admitting: Speech Pathology

## 2015-08-28 ENCOUNTER — Inpatient Hospital Stay (HOSPITAL_COMMUNITY): Payer: No Typology Code available for payment source | Admitting: Occupational Therapy

## 2015-08-28 ENCOUNTER — Inpatient Hospital Stay (HOSPITAL_COMMUNITY): Payer: No Typology Code available for payment source | Admitting: Physical Therapy

## 2015-08-28 LAB — COMPREHENSIVE METABOLIC PANEL
ALBUMIN: 2.5 g/dL — AB (ref 3.5–5.0)
ALT: 28 U/L (ref 17–63)
ANION GAP: 9 (ref 5–15)
AST: 47 U/L — AB (ref 15–41)
Alkaline Phosphatase: 43 U/L (ref 38–126)
BILIRUBIN TOTAL: 2.4 mg/dL — AB (ref 0.3–1.2)
BUN: 12 mg/dL (ref 6–20)
CHLORIDE: 96 mmol/L — AB (ref 101–111)
CO2: 31 mmol/L (ref 22–32)
Calcium: 8.9 mg/dL (ref 8.9–10.3)
Creatinine, Ser: 0.96 mg/dL (ref 0.61–1.24)
GFR calc Af Amer: >60 mL/min (ref >60)
GFR calc non Af Amer: >60 mL/min (ref >60)
GLUCOSE: 117 mg/dL — AB (ref 65–99)
POTASSIUM: 3.6 mmol/L (ref 3.5–5.1)
Sodium: 136 mmol/L (ref 135–145)
TOTAL PROTEIN: 5.7 g/dL — AB (ref 6.5–8.1)

## 2015-08-28 LAB — CBC WITH DIFFERENTIAL/PLATELET
BASOS ABS: 0 10*3/uL (ref 0.0–0.1)
BASOS PCT: 0 %
EOS ABS: 0.1 10*3/uL (ref 0.0–0.7)
EOS PCT: 1 %
HEMATOCRIT: 29.5 % — AB (ref 39.0–52.0)
Hemoglobin: 9.8 g/dL — ABNORMAL LOW (ref 13.0–17.0)
Lymphocytes Relative: 19 %
Lymphs Abs: 1.5 10*3/uL (ref 0.7–4.0)
MCH: 29.3 pg (ref 26.0–34.0)
MCHC: 33.2 g/dL (ref 30.0–36.0)
MCV: 88.3 fL (ref 78.0–100.0)
MONO ABS: 0.8 10*3/uL (ref 0.1–1.0)
MONOS PCT: 11 %
NEUTROS ABS: 5.1 10*3/uL (ref 1.7–7.7)
Neutrophils Relative %: 69 %
PLATELETS: 249 10*3/uL (ref 150–400)
RBC: 3.34 MIL/uL — ABNORMAL LOW (ref 4.22–5.81)
RDW: 13.8 % (ref 11.5–15.5)
WBC: 7.5 10*3/uL (ref 4.0–10.5)

## 2015-08-28 LAB — GLUCOSE, CAPILLARY
GLUCOSE-CAPILLARY: 114 mg/dL — AB (ref 65–99)
GLUCOSE-CAPILLARY: 112 mg/dL — AB (ref 65–99)
GLUCOSE-CAPILLARY: 107 mg/dL — AB (ref 65–99)
GLUCOSE-CAPILLARY: 122 mg/dL — AB (ref 65–99)

## 2015-08-28 LAB — PROTIME-INR
INR: 1.21
PROTHROMBIN TIME: 15.4 s — AB (ref 11.4–15.2)

## 2015-08-28 LAB — VITAMIN D 25 HYDROXY (VIT D DEFICIENCY, FRACTURES): VIT D 25 HYDROXY: 13.8 ng/mL — AB (ref 30.0–100.0)

## 2015-08-28 MED ORDER — WARFARIN SODIUM 5 MG PO TABS
10.0000 mg | ORAL_TABLET | Freq: Once | ORAL | Status: AC
  Administered 2015-08-28: 10 mg via ORAL
  Filled 2015-08-28: qty 2
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
## 2015-08-29 ENCOUNTER — Inpatient Hospital Stay (HOSPITAL_COMMUNITY): Payer: No Typology Code available for payment source

## 2015-08-29 DIAGNOSIS — K5901 Slow transit constipation: Secondary | ICD-10-CM

## 2015-08-29 LAB — GLUCOSE, CAPILLARY
Glucose-Capillary: 113 mg/dL — ABNORMAL HIGH (ref 65–99)
Glucose-Capillary: 121 mg/dL — ABNORMAL HIGH (ref 65–99)
Glucose-Capillary: 113 mg/dL — ABNORMAL HIGH (ref 65–99)
Glucose-Capillary: 105 mg/dL — ABNORMAL HIGH (ref 65–99)

## 2015-08-29 LAB — PROTIME-INR
INR: 1.67
PROTHROMBIN TIME: 19.9 s — AB (ref 11.4–15.2)

## 2015-08-29 MED ORDER — WARFARIN SODIUM 7.5 MG PO TABS
7.5000 mg | ORAL_TABLET | Freq: Once | ORAL | Status: AC
  Administered 2015-08-29: 7.5 mg via ORAL
  Filled 2015-08-29: qty 1
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
## 2015-08-29 NOTE — Progress Notes (Signed)
Physical Therapy Note  Patient Details  Name: BASIL GADISON MRN: [id number] Date of Birth: 1960-11-13 Today's Date: 08/29/2015  1450-1535. 45 min individual tx Pain: 5/10 L hip premedicated  Pt stated he had been propped up in bed at least 3 hours and felt very stiff. Therapeutic exercises performed with LE to increase strength for functional mobility x 10 each in supine : L active assistive heel slides, L active assistive hip abduction/adduction, bil glut set with L quad set, bil ankle pumps. Seated: long arc quad knee ext with 3 second hold LLE x 10. Reviewed total hip precautions with pt; he was able to state and identify movements which would be unsafe.  L elevating leg rest noted to have poor fit and locking ability on w/c; PT switched out and also added apple wood insert to [place] basic cushion for better pelvic support.  PT instructed pt and dtr in using a small towel roll under pelvis PRN for pain when he is in supine. Bed> w/c transfer.  Pt left resting in w/c all needs within reach. Falls precautions reviewed with pt and dtr.  Milanna Kozlov 08/29/2015, 10:43 AM
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
## 2015-08-30 ENCOUNTER — Inpatient Hospital Stay (HOSPITAL_COMMUNITY): Payer: No Typology Code available for payment source | Admitting: Speech Pathology

## 2015-08-30 ENCOUNTER — Inpatient Hospital Stay (HOSPITAL_COMMUNITY): Payer: Self-pay | Admitting: Physical Therapy

## 2015-08-30 ENCOUNTER — Inpatient Hospital Stay (HOSPITAL_COMMUNITY): Payer: No Typology Code available for payment source | Admitting: Occupational Therapy

## 2015-08-30 DIAGNOSIS — S32452G Displaced transverse fracture of left acetabulum, subsequent encounter for fracture with delayed healing: Secondary | ICD-10-CM

## 2015-08-30 LAB — PROTIME-INR
INR: 2.22
PROTHROMBIN TIME: 25 s — AB (ref 11.4–15.2)

## 2015-08-30 LAB — GLUCOSE, CAPILLARY
GLUCOSE-CAPILLARY: 94 mg/dL (ref 65–99)
Glucose-Capillary: 96 mg/dL (ref 65–99)
Glucose-Capillary: 102 mg/dL — ABNORMAL HIGH (ref 65–99)
Glucose-Capillary: 100 mg/dL — ABNORMAL HIGH (ref 65–99)

## 2015-08-30 LAB — CALCITRIOL (1,25 DI-OH VIT D): Vit D, 1,25-Dihydroxy: 52.9 pg/mL (ref 19.9–79.3)

## 2015-08-30 MED ORDER — WARFARIN SODIUM 7.5 MG PO TABS
7.5000 mg | ORAL_TABLET | Freq: Once | ORAL | Status: AC
  Administered 2015-08-30: 7.5 mg via ORAL
  Filled 2015-08-30: qty 1

## 2015-08-30 NOTE — Care Management Note (Signed)
Inpatient [place] Individual Statement of Services  Patient Name:  [name]  Date:  08/30/2015  Welcome to the [facility].  Our goal is to provide you with an individualized program based on your diagnosis and situation, designed to meet your specific needs.  With this comprehensive rehabilitation program, you will be expected to participate in at least 3 hours of rehabilitation therapies Monday-Friday, with modified therapy programming on the weekends.  Your rehabilitation program will include the following services:  Physical Therapy (PT), Occupational Therapy (OT), Speech Therapy (ST), 24 hour per day rehabilitation nursing, Therapeutic Recreaction (TR), Neuropsychology, Case Management (Social Worker), Rehabilitation Medicine, Nutrition Services and Pharmacy Services  Weekly team conferences will be held on Tuesday to discuss your progress.  Your Social Worker will talk with you frequently to get your input and to update you on team discussions.  Team conferences with you and your family in attendance may also be held.  Expected length of stay: 12-14 days  Overall anticipated outcome: supervision-mod/i level  Depending on your progress and recovery, your program may change. Your Social Worker will coordinate services and will keep you informed of any changes. Your Social Worker's name and contact numbers are listed  below.  The following services may also be recommended but are not provided by the [facility] will be made to provide these services after discharge if needed.  Arrangements include referral to agencies that provide these services.  Your insurance has been verified to be:  Med Pay Your primary doctor is:  Wenda Low  Pertinent information will be shared with your doctor and  your insurance company.  Social Worker:  Lennart Pall, [place] or ([phone]   Information discussed with and copy given to patient by: Elease Hashimoto, 08/30/2015, 12:47 PM
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
## 2015-08-30 NOTE — Progress Notes (Signed)
Speech Language Pathology Daily Session Note  Patient Details  Name: [name] MRN: [id number] Date of Birth: Jan 02, 1961  Today's Date: 08/30/2015 SLP Individual Time: 1100-1130 SLP Individual Time Calculation (min): 30 min   Short Term Goals: Week 1: SLP Short Term Goal 1 (Week 1): Pt to complete complex reasoning tasks at mod I. SLP Short Term Goal 2 (Week 1): Pt to demonstrate recall of novel information at mod I. SLP Short Term Goal 3 (Week 1): Pt to demonstrate ability to manage medications at mod I.  SLP Short Term Goal 4 (Week 1): Pt to self-monitor and self-correct with min verbal cueing.   Skilled Therapeutic Interventions:Pt seen for skilled SLP therapy targeting cognitive-linguistic goals. Pt able to recall details from previous therapy session and reports feeling much better now. Pt able to complete moderately complex reasoning problems with intermittent min A for idea completion. Pt frequently stopped work just shy of arriving at the correct answer. Encouraged pt to develop plan and then execute plan.    Function:  Eating Eating                 Cognition Comprehension Comprehension assist level: Understands complex 90% of the time/cues 10% of the time  Expression   Expression assist level: Expresses complex ideas: With extra time/assistive device  Social Interaction Social Interaction assist level: Interacts appropriately with others - No medications needed.  Problem Solving Problem solving assist level: Solves complex 90% of the time/cues < 10% of the time  Memory Memory assist level: More than reasonable amount of time    Pain Pain Assessment Pain Assessment: No/denies pain  Therapy/Group: Individual Therapy  Max Bergamo MA, CCC-SLP 08/30/2015, 12:21 PM

## 2015-08-30 NOTE — Addendum Note (Signed)
Addendum  created 08/30/15 1918 by Rica Koyanagi, MD   Anesthesia Attestations filed
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
## 2015-08-30 NOTE — Progress Notes (Signed)
Patient information reviewed and entered into [facility] system by Delene Morais, RN, CRRN, PPS Coordinator.  Information including medical coding and functional independence measure will be reviewed and updated through discharge.    

## 2015-08-30 NOTE — Progress Notes (Signed)
Speech Language Pathology Daily Session Note  Patient Details  Name: [name] MRN: [id number] Date of Birth: 12/02/1960  Today's Date: 08/30/2015 SLP Individual Time: 0730-0830 SLP Individual Time Calculation (min): 60 min   Short Term Goals: Week 1: SLP Short Term Goal 1 (Week 1): Pt to complete complex reasoning tasks at mod I. SLP Short Term Goal 2 (Week 1): Pt to demonstrate recall of novel information at mod I. SLP Short Term Goal 3 (Week 1): Pt to demonstrate ability to manage medications at mod I.  SLP Short Term Goal 4 (Week 1): Pt to self-monitor and self-correct with min verbal cueing.   Skilled Therapeutic Interventions: Skilled treatment session focused on cognitive goals. SLP facilitated session by providing Min A question cues for recall of his current medications and their functions. Patient also required Supervision verbal cues for problem solving during a mildly complex medication management task of organizing a 4 time per day pill box. Patient was also Mod I for recall of hip precautions. Patient left supine in bed with all needs within reach. Continue with current plan of care.   Function:   Cognition Comprehension Comprehension assist level: Understands complex 90% of the time/cues 10% of the time  Expression   Expression assist level: Expresses complex ideas: With extra time/assistive device  Social Interaction Social Interaction assist level: Interacts appropriately with others - No medications needed.  Problem Solving Problem solving assist level: Solves complex 90% of the time/cues < 10% of the time  Memory Memory assist level: More than reasonable amount of time    Pain Pain Assessment Pain Score: 3   Therapy/Group: Individual Therapy  Lilyan Prete 08/30/2015, 12:11 PM

## 2015-08-30 NOTE — Progress Notes (Signed)
Occupational Therapy Session Note  Patient Details  Name: [name] MRN: [id number] Date of Birth: 1960-08-19  Today's Date: 08/30/2015 OT Individual Time: 1400-1456 OT Individual Time Calculation (min): 56 min     Short Term Goals: Week 1:  OT Short Term Goal 1 (Week 1): Pt will don pants with AE with S. OT Short Term Goal 2 (Week 1): Pt will don socks with AE with S. OT Short Term Goal 3 (Week 1): Pt will sit to EOB with S to prepare for transfers. OT Short Term Goal 4 (Week 1): Pt will stand with RW to don pants over hips with S.  Skilled Therapeutic Interventions/Progress Updates:     Upon entering the room, pt seated in wheelchair with 3/10n c/o soreness in L LE. Pt verbalizing hip precautions with 100% accuracy and following precautions throughout session. Pt requesting to shower this session. OT reinforced L hip dressing in order for it to not get wet. Pt ambulated 10' into bathroom with close supervision and sitting onto TTB in walk in shower. Pt engaged in bathing from shower level with use of long handled sponge to increase I with LB self care. Pt performed bathing while seated on TTB and lateral lean to the R to wash buttocks. Pt utilizing [place] reacher to don pants over B feet. Pt standing with steady assist to don over B hips safely. Pt ambulated back to wheelchair where OT educated pt on use of sock aide with pt returning demonstration for B feet with min verbal cues. Pt remained in wheelchair at end of session with call bell and all needed items within reach upon exiting the room.   Therapy Documentation Precautions:  Precautions Precautions: Fall, Posterior Hip Precaution Comments: recalled 3/3 hip precautions Restrictions Weight Bearing Restrictions: Yes LLE Weight Bearing: Touchdown weight bearing General:   Vital Signs:   Pain: Pain Assessment Pain Assessment: No/denies pain Pain Score: 3  ADL: ADL ADL Comments: refer to functional navigator Exercises:    Other Treatments:    See Function Navigator for Current Functional Status.   Therapy/Group: Individual Therapy  Phineas Semen 08/30/2015, 3:05 PM

## 2015-08-30 NOTE — Progress Notes (Signed)
Physical Therapy Session Note  Patient Details  Name: [name] MRN: [id number] Date of Birth: December 22, 1960  Today's Date: 08/30/2015 PT Individual Time: 1300-1355 PT Individual Time Calculation (min): 55 min    Short Term Goals: Week 1:  PT Short Term Goal 1 (Week 1): Patient will perform bed mobility with supervision. PT Short Term Goal 2 (Week 1): Patient will transfer sit > stand using RW with supervision.  PT Short Term Goal 3 (Week 1): Patient will ambulate 50 ft using RW with supervision.  PT Short Term Goal 4 (Week 1): Patient will negotiate up/down 1 step using RW with min A.   Skilled Therapeutic Interventions/Progress Updates:    Pt received resting in bed with c/o 4/10 pain but reports being pre-medicated, agreeable to therapy session.  Session focus on gait training, transfers, and activity tolerance.  Pt transitioned supine>sitting EOB with supervision using bed rails and HOB elevated.  Squat/pivot from EOB>w/c with steady assist for safety and mod verbal cues for set up.  Pt propelled w/c throughout unit, max distance 300' with supervision.  PT provided education on w/c safety including approximating chair to transfer surface and locking brakes when w/c was not moving.  Pt verbalized understanding.  Gait training 2x75' with RW and close supervision, short rest break between trials.  Pt able to maintain weight bearing status and recall 3/3 hip precautions.  PT instructed pt in car transfer with RW and min assist to lift LLE out of car.  PT discussed set up of actual car when transferring at d/c and pt verbalized understanding but education to continue.  Pt returned to room at end of session and left upright in w/c to await next session, call bell in reach and needs met.    Therapy Documentation Precautions:  Precautions Precautions: Fall, Posterior Hip Precaution Comments: recalled 3/3 hip precautions Restrictions Weight Bearing Restrictions: Yes LLE Weight Bearing: Touchdown  weight bearing   See Function Navigator for Current Functional Status.   Therapy/Group: Individual Therapy  Earnest Conroy Penven-Crew 08/30/2015, 1:59 PM

## 2015-08-31 ENCOUNTER — Inpatient Hospital Stay (HOSPITAL_COMMUNITY): Payer: No Typology Code available for payment source | Admitting: Speech Pathology

## 2015-08-31 ENCOUNTER — Inpatient Hospital Stay (HOSPITAL_COMMUNITY): Payer: Self-pay | Admitting: *Deleted

## 2015-08-31 ENCOUNTER — Inpatient Hospital Stay (HOSPITAL_COMMUNITY): Payer: Self-pay | Admitting: Occupational Therapy

## 2015-08-31 ENCOUNTER — Inpatient Hospital Stay (HOSPITAL_COMMUNITY): Payer: Self-pay | Admitting: Physical Therapy

## 2015-08-31 DIAGNOSIS — S32415S Nondisplaced fracture of anterior wall of left acetabulum, sequela: Secondary | ICD-10-CM

## 2015-08-31 LAB — GLUCOSE, CAPILLARY
GLUCOSE-CAPILLARY: 108 mg/dL — AB (ref 65–99)
Glucose-Capillary: 90 mg/dL (ref 65–99)
Glucose-Capillary: 96 mg/dL (ref 65–99)
Glucose-Capillary: 117 mg/dL — ABNORMAL HIGH (ref 65–99)

## 2015-08-31 LAB — PROTIME-INR
INR: 2.09
PROTHROMBIN TIME: 23.8 s — AB (ref 11.4–15.2)

## 2015-08-31 MED ORDER — WARFARIN SODIUM 7.5 MG PO TABS
7.5000 mg | ORAL_TABLET | Freq: Once | ORAL | Status: AC
  Administered 2015-08-31: 7.5 mg via ORAL
  Filled 2015-08-31: qty 1

## 2015-08-31 NOTE — Progress Notes (Signed)
Occupational Therapy Session Note  Patient Details  Name: [name] MRN: [id number] Date of Birth: 08-28-1960  Today's Date: 08/31/2015 OT Individual Time: 0930-1030 OT Individual Time Calculation (min): 60 min     Short Term Goals:Week 1:  OT Short Term Goal 1 (Week 1): Pt will don pants with AE with S. OT Short Term Goal 2 (Week 1): Pt will don socks with AE with S. OT Short Term Goal 3 (Week 1): Pt will sit to EOB with S to prepare for transfers. OT Short Term Goal 4 (Week 1): Pt will stand with RW to don pants over hips with S.  Skilled Therapeutic Interventions/Progress Updates:    1:1 self care retraining at shower level. Focus on bed mobility, sit to stands with proper hand placement, functional ambulation while maintaining her weight bearing status, shower transfer and d/c home planning. Pt with min cuing for problem solving how to wash LEs with AE. After dressing with setup and extra time. Focus on shower transfer with simulated shower stall hopping backwards over the ledge into a shower chair or BSC with steadying A. Discussed what DME he wanted in shower. Discussed option of BSC to be able to better access washing backside without having to stand due to not having anything for UE support. Left in w/c with call bell.    Therapy Documentation Precautions:  Precautions Precautions: Fall, Posterior Hip Precaution Comments: recalled 3/3 hip precautions Restrictions Weight Bearing Restrictions: Yes LLE Weight Bearing: Touchdown weight bearing Pain: Mild pain but able to continue- meds before session ADL: ADL ADL Comments: refer to functional navigator  See Function Navigator for Current Functional Status.   Therapy/Group: Individual Therapy  Willeen Cass [facility] 08/31/2015, 3:36 PM
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
## 2015-08-31 NOTE — Progress Notes (Signed)
ANTICOAGULATION CONSULT NOTE - Follow Up Consult  Pharmacy Consult for couamdin Indication: VTE prophylaxis  Allergies  Allergen Reactions  . Celecoxib Palpitations  . Codeine Sulfate Nausea And Vomiting and Other (See Comments)    MIGRAINE  . Sulfa Antibiotics Other (See Comments)    False hep reading    Patient Measurements: Height: 5\' 6"  (167.6 cm) Weight:  (need to zero bed during therapy eval next AM) IBW/kg (Calculated) : 63.8 Heparin Dosing Weight:   Vital Signs: Temp: 98.3 F (36.8 C) (08/22 0544) Temp Source: Oral (08/22 0544) BP: 103/69 (08/22 0544) Pulse Rate: 89 (08/22 0544)  Labs:  Recent Labs  08/28/15 1207 08/29/15 0436 08/30/15 0826  LABPROT 15.4* 19.9* 25.0*  INR 1.21 1.67 2.22    Estimated Creatinine Clearance: 95 mL/min (by C-G formula based on SCr of 0.96 mg/dL).   Medications:  Scheduled:  . amLODipine  10 mg Oral Daily  . atorvastatin  20 mg Oral Daily  . bacitracin   Topical BID  . enoxaparin (LOVENOX) injection  30 mg Subcutaneous Q12H  . hydrochlorothiazide  25 mg Oral Daily  . irbesartan  300 mg Oral Daily  . iron polysaccharides  150 mg Oral q12n4p  . metFORMIN  500 mg Oral BID WC  . oxyCODONE  10 mg Oral Q12H  . pantoprazole  40 mg Oral Daily  . polyethylene glycol  17 g Oral BID  . traMADol  50 mg Oral TID WC & HS  . Warfarin - Pharmacist Dosing Inpatient   Does not apply q1800   Infusions:    Assessment: 55 yo male is currently on therapeutic coumadin for VTE prophylaxis.  INR today is 2.09.  Goal of Therapy:  INR 2-3 Monitor platelets by anticoagulation protocol: Yes   Plan:  - d/c lovenox - coumadin 7.5 mg po x1 - INR in am  Harland Aguiniga, Tsz-Yin 08/31/2015,8:25 AM

## 2015-08-31 NOTE — Progress Notes (Signed)
Speech Language Pathology Daily Session Note  Patient Details  Name: [name] MRN: [id number] Date of Birth: 02-May-1960  Today's Date: 08/31/2015 SLP Individual Time: 0830-0930 SLP Individual Time Calculation (min): 60 min   Short Term Goals: Week 1: SLP Short Term Goal 1 (Week 1): Pt to complete complex reasoning tasks at mod I. SLP Short Term Goal 2 (Week 1): Pt to demonstrate recall of novel information at mod I. SLP Short Term Goal 3 (Week 1): Pt to demonstrate ability to manage medications at mod I.  SLP Short Term Goal 4 (Week 1): Pt to self-monitor and self-correct with min verbal cueing.   Skilled Therapeutic Interventions: Skilled treatment session focused on cognitive goals. SLP facilitated session by providing supervision question cues for patient to self-monitor and correct errors during a mildly complex money management task and scheduling task in a moderately distracting environment.  Patient left upright in bed with all needs within reach. Continue with current plan of care.   Function:   Cognition Comprehension Comprehension assist level: Understands complex 90% of the time/cues 10% of the time  Expression   Expression assist level: Expresses complex ideas: With extra time/assistive device  Social Interaction Social Interaction assist level: Interacts appropriately with others - No medications needed.  Problem Solving Problem solving assist level: Solves complex 90% of the time/cues < 10% of the time  Memory Memory assist level: More than reasonable amount of time    Pain No/Denies Pain   Therapy/Group: Individual Therapy  Alondra Sahni 08/31/2015, 3:20 PM
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
## 2015-09-01 ENCOUNTER — Encounter (HOSPITAL_COMMUNITY): Payer: Self-pay | Admitting: *Deleted

## 2015-09-01 ENCOUNTER — Inpatient Hospital Stay (HOSPITAL_COMMUNITY): Payer: No Typology Code available for payment source | Admitting: Speech Pathology

## 2015-09-01 ENCOUNTER — Inpatient Hospital Stay (HOSPITAL_COMMUNITY): Payer: Self-pay

## 2015-09-01 LAB — PROTIME-INR
INR: 2.2
Prothrombin Time: 24.8 seconds — ABNORMAL HIGH (ref 11.4–15.2)

## 2015-09-01 LAB — GLUCOSE, CAPILLARY
GLUCOSE-CAPILLARY: 103 mg/dL — AB (ref 65–99)
GLUCOSE-CAPILLARY: 97 mg/dL (ref 65–99)
Glucose-Capillary: 99 mg/dL (ref 65–99)
Glucose-Capillary: 87 mg/dL (ref 65–99)

## 2015-09-01 MED ORDER — SORBITOL 70 % SOLN: 30.0000 mL | Status: AC

## 2015-09-01 MED ORDER — WARFARIN SODIUM 7.5 MG PO TABS
7.5000 mg | ORAL_TABLET | Freq: Once | ORAL | Status: AC
  Administered 2015-09-01: 7.5 mg via ORAL
  Filled 2015-09-01: qty 1
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
## 2015-09-01 NOTE — Progress Notes (Signed)
Physical Therapy Session Note  Patient Details  Name: [name] MRN: [id number] Date of Birth: 08-13-1960  Today's Date: 09/01/2015 PT Individual Time: 1100-1215 PT Individual Time Calculation (min): 75 min    Short Term Goals: Week 1:  PT Short Term Goal 1 (Week 1): Patient will perform bed mobility with supervision. PT Short Term Goal 2 (Week 1): Patient will transfer sit > stand using RW with supervision.  PT Short Term Goal 3 (Week 1): Patient will ambulate 50 ft using RW with supervision.  PT Short Term Goal 4 (Week 1): Patient will negotiate up/down 1 step using RW with min A.   Skilled Therapeutic Interventions/Progress Updates:    Patient participated in community outing to [employer] with recreational therapist at overall supervision level using RW. Focus on community ambulation and wheelchair mobility, curb negotiation, activity tolerance, adhering to precautions, energy conservation, and discharge planning. Patient met all goals. See outing goal sheet in shadow chart for more details.   Therapy Documentation Precautions:  Precautions Precautions: Fall, Posterior Hip Precaution Comments: recalled 3/3 hip precautions Restrictions Weight Bearing Restrictions: Yes LLE Weight Bearing: Touchdown weight bearing Pain: Pain Assessment Pain Assessment: No/denies pain, premedicated   See Function Navigator for Current Functional Status.   Therapy/Group: Individual Therapy  Laretta Alstrom 09/01/2015, 2:00 PM

## 2015-09-01 NOTE — Progress Notes (Signed)
Recreational Therapy Discharge Summary Patient Details  Name: ANIKEN STOLLINGS MRN: [id number] Date of Birth: March 14, 1960 Today's Date: 09/01/2015  Pain: no c/o  Pt participated in community reintegration/outing to [employer] at overall close supervision level.  Goals focused on activity tolerance, community ambulation/w/c mobility, identification & negotiation of obstacles, energy conservation & discharge planning.  See outing goal sheet in shadow chart for detials.  Comments on progress toward goals: See above.  Pt is scheduled for discharge home 8/26.  No futher TR  Reasons for discharge: discharge from hospital  Patient/family agrees with progress made and goals achieved: Yes  Delio Slates 09/01/2015, 12:43 PM

## 2015-09-01 NOTE — Progress Notes (Signed)
Occupational Therapy Session Note  Patient Details  Name: [name] MRN: [id number] Date of Birth: 1960-09-17  Today's Date: 09/01/2015 OT Individual Time: 0800-0900 OT Individual Time Calculation (min): 60 min     Short Term Goals: Week 1:  OT Short Term Goal 1 (Week 1): Pt will don pants with AE with S. OT Short Term Goal 2 (Week 1): Pt will don socks with AE with S. OT Short Term Goal 3 (Week 1): Pt will sit to EOB with S to prepare for transfers. OT Short Term Goal 4 (Week 1): Pt will stand with RW to don pants over hips with S.  Skilled Therapeutic Interventions/Progress Updates:    Pt resting in bed upon arrival and agreeable to engaging in BADLs.  Pt recalled weight bearing precautions and L hip precautions.  Pt amb with RW to bathroom to use toilet prior to transferring to shower seat.  Pt completed bathing tasks with sit<>stand from seat requiring assistance for bathing buttocks and BLE without AD.  Pt used reacher and sock aid to assist with LB dressing tasks while seated in w/c at sink.  Pt requires more than a reasonable amount of time to complete tasks.  Pt did not exhibit any unsafe behaviors. Focus on activity tolerance, functional amb with RW, standing balance, and safety awareness to increase independence with BADLs.  Therapy Documentation Precautions:  Precautions Precautions: Fall, Posterior Hip Precaution Comments: recalled 3/3 hip precautions Restrictions Weight Bearing Restrictions: Yes LLE Weight Bearing: Touchdown weight bearing Pain: Pain Assessment Pain Assessment: 0-10 Pain Score: 5 with activity ADL: ADL ADL Comments: refer to functional navigator  See Function Navigator for Current Functional Status.   Therapy/Group: Individual Therapy  Leroy Libman 09/01/2015, 9:01 AM

## 2015-09-01 NOTE — Progress Notes (Signed)
Social Work Patient ID: [name], male   DOB: Dec 13, 1960, 55 y.o.   MRN: [id number]   Have reviewed team conference info with pt who is aware and agreeable with targeted d/c date of 8/26 with mod ind goals.  Addressed his questions about possible disability or medicaid and explained that MD does not feel this is a long term disability and, therefore, would not qualify for either program.  I have also asked that the [facility] financial counselor, Abelina Bachelor, to meet with pt prior to d/c to address his financial concerns.  Will arrange follow up services and DME.  Continue to follow.  Shelise Maron, LCSW
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
## 2015-09-01 NOTE — Progress Notes (Signed)
ANTICOAGULATION CONSULT NOTE - Follow Up Consult  Pharmacy Consult for coumadin Indication: VTE prophylaxis  Allergies  Allergen Reactions  . Celecoxib Palpitations  . Codeine Sulfate Nausea And Vomiting and Other (See Comments)    MIGRAINE  . Sulfa Antibiotics Other (See Comments)    False hep reading    Patient Measurements: Height: 5\' 6"  (167.6 cm) Weight:  (need to zero bed during therapy eval next AM) IBW/kg (Calculated) : 63.8 Heparin Dosing Weight:   Vital Signs: Temp: 98.4 F (36.9 C) (08/23 0602) Temp Source: Oral (08/23 0602) BP: 131/75 (08/23 0602) Pulse Rate: 93 (08/23 0602)  Labs:  Recent Labs  08/30/15 0826 08/31/15 0812 09/01/15 0451  LABPROT 25.0* 23.8* 24.8*  INR 2.22 2.09 2.20    Estimated Creatinine Clearance: 95 mL/min (by C-G formula based on SCr of 0.96 mg/dL).   Medications:  Scheduled:  . amLODipine  10 mg Oral Daily  . atorvastatin  20 mg Oral Daily  . bacitracin   Topical BID  . hydrochlorothiazide  25 mg Oral Daily  . irbesartan  300 mg Oral Daily  . iron polysaccharides  150 mg Oral q12n4p  . metFORMIN  500 mg Oral BID WC  . oxyCODONE  10 mg Oral Q12H  . pantoprazole  40 mg Oral Daily  . polyethylene glycol  17 g Oral BID  . traMADol  50 mg Oral TID WC & HS  . Warfarin - Pharmacist Dosing Inpatient   Does not apply q1800   Infusions:    Assessment: 55 yo male s/p ortho surgery is currently on therapeutic coumadin.  INR today is 2.2.  Goal of Therapy:  INR 2-3 Monitor platelets by anticoagulation protocol: Yes   Plan:  - coumadin 7.5mg  po x1 - INR in am  Quirino Kakos, Tsz-Yin 09/01/2015,8:34 AM

## 2015-09-01 NOTE — Progress Notes (Signed)
Speech Language Pathology Daily Session Note  Patient Details  Name: [name] MRN: [id number] Date of Birth: 1960-11-08  Today's Date: 09/01/2015 SLP Individual Time: 0915-1000 SLP Individual Time Calculation (min): 45 min   Short Term Goals: Week 1: SLP Short Term Goal 1 (Week 1): Pt to complete complex reasoning tasks at mod I. SLP Short Term Goal 2 (Week 1): Pt to demonstrate recall of novel information at mod I. SLP Short Term Goal 3 (Week 1): Pt to demonstrate ability to manage medications at mod I.  SLP Short Term Goal 4 (Week 1): Pt to self-monitor and self-correct with min verbal cueing.   Skilled Therapeutic Interventions: Skilled treatment session focused on cognitive goals. SLP facilitated session by providing Min A verbal cues and extra time for problem solving during a novel and complex new learning task. Patient demonstrated delayed processing throughout task and required Min A question cues for emergent awareness of difficulty. Patient also recalled functional information from a conversation with CSW from earlier in the session after a 30 minute delay with supervision verbal cues. Patient left upright in recliner with all needs within reach. Continue with current plan of care.   Function:  Cognition Comprehension Comprehension assist level: Understands complex 90% of the time/cues 10% of the time  Expression   Expression assist level: Expresses complex ideas: With extra time/assistive device  Social Interaction Social Interaction assist level: Interacts appropriately with others - No medications needed.  Problem Solving Problem solving assist level: Solves complex 90% of the time/cues < 10% of the time  Memory Memory assist level: More than reasonable amount of time    Pain Pain Assessment Pain Assessment: No/denies pain  Therapy/Group: Individual Therapy  Elton Catalano 09/01/2015, 2:15 PM

## 2015-09-02 ENCOUNTER — Inpatient Hospital Stay (HOSPITAL_COMMUNITY): Payer: Self-pay

## 2015-09-02 ENCOUNTER — Inpatient Hospital Stay (HOSPITAL_COMMUNITY): Payer: No Typology Code available for payment source | Admitting: Physical Therapy

## 2015-09-02 ENCOUNTER — Inpatient Hospital Stay (HOSPITAL_COMMUNITY): Payer: No Typology Code available for payment source | Admitting: Speech Pathology

## 2015-09-02 LAB — GLUCOSE, CAPILLARY
GLUCOSE-CAPILLARY: 127 mg/dL — AB (ref 65–99)
GLUCOSE-CAPILLARY: 102 mg/dL — AB (ref 65–99)
Glucose-Capillary: 94 mg/dL (ref 65–99)

## 2015-09-02 LAB — PROTIME-INR
INR: 2.45
Prothrombin Time: 27.1 seconds — ABNORMAL HIGH (ref 11.4–15.2)

## 2015-09-02 MED ORDER — WARFARIN SODIUM 7.5 MG PO TABS
7.5000 mg | ORAL_TABLET | Freq: Once | ORAL | Status: AC
  Administered 2015-09-02: 7.5 mg via ORAL
  Filled 2015-09-02: qty 1

## 2015-09-02 NOTE — Progress Notes (Signed)
Speech Language Pathology Daily Session Note  Patient Details  Name: [name] MRN: [id number] Date of Birth: 02/27/1960  Today's Date: 09/02/2015 SLP Individual Time: [id number] SLP Individual Time Calculation (min): 45 min   Short Term Goals: Week 1: SLP Short Term Goal 1 (Week 1): Pt to complete complex reasoning tasks at mod I. SLP Short Term Goal 2 (Week 1): Pt to demonstrate recall of novel information at mod I. SLP Short Term Goal 3 (Week 1): Pt to demonstrate ability to manage medications at mod I.  SLP Short Term Goal 4 (Week 1): Pt to self-monitor and self-correct with min verbal cueing.   Skilled Therapeutic Interventions: skilled treatment session focused on cogniton goals. SLP facilitated the session by re-administering the MoCA version 7.1. Pt obtained a score of 26 out of 30 possible points. Pt's score is considered within the average range (average range is 26 points or higher). Pt's initial score on 25/30 on 08/28/2015. Pt pleased with progress. Education handout on memory strategies reviewed and left with pt. Pt able to describe and sequence recently learned game independently. Education provided and pt able to problem solve home-related situations such as having daughter review finances etc upon initial return to home activities. Pt left in bed with all needs within reach. Continue current plan of care.   Function:   Cognition Comprehension Comprehension assist level: Follows complex conversation/direction with extra time/assistive device  Expression   Expression assist level: Expresses complex ideas: With extra time/assistive device  Social Interaction Social Interaction assist level: Interacts appropriately with others - No medications needed.  Problem Solving Problem solving assist level: Solves complex problems: With extra time  Memory Memory assist level: Complete Independence: No helper    Pain    Therapy/Group: Individual Therapy  Amesha Bailey 09/02/2015,  10:36 AM

## 2015-09-02 NOTE — Progress Notes (Signed)
Social Work Patient ID: [name], male   DOB: 17-May-1960, 55 y.o.   MRN: [id number]  Have reviewed team conference with pt who is aware and agreeable with targeted d/c date 8/26 and modified independent goals overall.  No concerns.  Reviewed follow up services to be arranged.  Io Dieujuste, LCSW

## 2015-09-02 NOTE — Progress Notes (Signed)
Occupational Therapy Note  Patient Details  Name: [name] MRN: [id number] Date of Birth: 05-31-1960  Today's Date: 09/02/2015 OT Individual Time: 1100-1200 OT Individual Time Calculation (min): 60 min    Pt denied pain although grimaced during sit<>stand; repositioned Individual Therapy  Pt propelled w/c to ADL apartment/kitchen to practice kitchen activities and home mgmt tasks.  Pt educated on RW safety and issued a walker bag and reacher bag.  Pt completed simple meal/snack in kitchen while standing at counter.  Pt used reacher to assist with opening dishwasher to prevent bending over.  Pt completed simple home mgmt tasks with focus on RW safety.  Pt practiced shower stall transfers using BSC as shower chair to provide BUE support with sit<>stand and standing.  Continued discharge planning.    [name] 09/02/2015, 12:17 PM

## 2015-09-02 NOTE — Progress Notes (Signed)
Physical Therapy Session Note  Patient Details  Name: [name] MRN: [id number] Date of Birth: 04-Jul-1960  Today's Date: 09/02/2015 PT Individual Time: [id number] PT Individual Time Calculation (min): 40 min    Short Term Goals: Week 1:  PT Short Term Goal 1 (Week 1): Patient will perform bed mobility with supervision. PT Short Term Goal 2 (Week 1): Patient will transfer sit > stand using RW with supervision.  PT Short Term Goal 3 (Week 1): Patient will ambulate 50 ft using RW with supervision.  PT Short Term Goal 4 (Week 1): Patient will negotiate up/down 1 step using RW with min A.   Skilled Therapeutic Interventions/Progress Updates:    Patient in bed, transferred to EOB using rail with supervision. Performed sit <> stand transfers and functional ambulation using RW x 90 ft with supervision. Bed mobility on flat therapy table with min A to lift LLE on bed sit > supine, supervision supine > sit. Instructed in LLE HEP for strengthening and ROM: seated LAQ x 20, supine quad sets with 5 sec hold x 20, heel slides with maxi slide 2 x 10, SAQ over bolster x 20, ankle pumps x 20; standing using RW: LLE hip flexion, hip extension, and knee flexion x 10 each exercise. Patient provided with handout of HEP. Patient left semi reclined in bed with all needs in reach. Patient with no further questions/concerns regarding DC.    Therapy Documentation Precautions:  Precautions Precautions: Fall, Posterior Hip Precaution Comments: recalled 3/3 hip precautions Restrictions Weight Bearing Restrictions: Yes LLE Weight Bearing: Touchdown weight bearing Pain: Pain Assessment Pain Assessment: 0-10 Pain Score: 5  Pain Type: Acute pain Pain Location: Hip Pain Orientation: Left Pain Descriptors / Indicators: Aching Pain Onset: With Activity Pain Intervention(s): Rest;Repositioned   See Function Navigator for Current Functional Status.   Therapy/Group: Individual Therapy  Laretta Alstrom 09/02/2015, 1:40 PM
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
## 2015-09-02 NOTE — Progress Notes (Signed)
ANTICOAGULATION CONSULT NOTE - Follow Up Consult  Pharmacy Consult for coumadin Indication: VTE prophylaxis  Allergies  Allergen Reactions  . Celecoxib Palpitations  . Codeine Sulfate Nausea And Vomiting and Other (See Comments)    MIGRAINE  . Sulfa Antibiotics Other (See Comments)    False hep reading    Patient Measurements: Height: 5\' 6"  (167.6 cm) Weight:  (need to zero bed during therapy eval next AM) IBW/kg (Calculated) : 63.8 Heparin Dosing Weight:   Vital Signs: Temp: 98.4 F (36.9 C) (08/24 0455) Temp Source: Oral (08/24 0455) BP: 135/80 (08/24 0455) Pulse Rate: 91 (08/24 0455)  Labs:  Recent Labs  08/31/15 0812 09/01/15 0451 09/02/15 0542  LABPROT 23.8* 24.8* 27.1*  INR 2.09 2.20 2.45    Estimated Creatinine Clearance: 95 mL/min (by C-G formula based on SCr of 0.96 mg/dL).   Medications:  Scheduled:  . amLODipine  10 mg Oral Daily  . atorvastatin  20 mg Oral Daily  . bacitracin   Topical BID  . hydrochlorothiazide  25 mg Oral Daily  . irbesartan  300 mg Oral Daily  . iron polysaccharides  150 mg Oral q12n4p  . metFORMIN  500 mg Oral BID WC  . oxyCODONE  10 mg Oral Q12H  . pantoprazole  40 mg Oral Daily  . polyethylene glycol  17 g Oral BID  . sorbitol  30 mL Oral NOW  . traMADol  50 mg Oral TID WC & HS  . Warfarin - Pharmacist Dosing Inpatient   Does not apply q1800   Infusions:    Assessment: 55 yo male is currently on therapeutic coumadin for VTE prophylaxis. INR today is 2.45.  Goal of Therapy:  INR 2-3 Monitor platelets by anticoagulation protocol: Yes   Plan:  - coumadin 7.5 mg po x1 - INR in am  Kannon Granderson, Tsz-Yin 09/02/2015,8:25 AM

## 2015-09-02 NOTE — Progress Notes (Signed)
Occupational Therapy Session Note  Patient Details  Name: [name] MRN: [id number] Date of Birth: 09-26-1960  Today's Date: 09/02/2015 OT Individual Time: 0800-0900 OT Individual Time Calculation (min): 60 min     Short Term Goals: Week 1:  OT Short Term Goal 1 (Week 1): Pt will don pants with AE with S. OT Short Term Goal 2 (Week 1): Pt will don socks with AE with S. OT Short Term Goal 3 (Week 1): Pt will sit to EOB with S to prepare for transfers. OT Short Term Goal 4 (Week 1): Pt will stand with RW to don pants over hips with S.  Skilled Therapeutic Interventions/Progress Updates:    Pt engaged in BADL retraining with emphasis on increased independence.  Pt amb with RW to gather clothing prior to amb into bathroom to complete bathing tasks at shower level.  Pt returned to room to complete dressing tasks.  Pt utilizes appropriate AE to assist with bathing and dressing tasks.  Pt requires more than a reasonable amount of time to complete tasks with multiple rest breaks.  Discussed energy conservation strategies and preplanning before engaging in multi-step activities.  Pt verbalized understanding.   Therapy Documentation Precautions:  Precautions Precautions: Fall, Posterior Hip Precaution Comments: recalled 3/3 hip precautions Restrictions Weight Bearing Restrictions: Yes LLE Weight Bearing: Touchdown weight bearing   Pain:  Pt initially denied pain but indicated increased discomfort in L hip with sit<>stand; repositioned ADL: ADL ADL Comments: refer to functional navigator  See Function Navigator for Current Functional Status.   Therapy/Group: Individual Therapy  Leroy Libman 09/02/2015, 10:40 AM

## 2015-09-02 NOTE — Discharge Instructions (Signed)
Inpatient Rehab Discharge Instructions  [name] Discharge date and time: No discharge date for patient encounter.   Activities/Precautions/ Functional Status: Activity: Touchdown weightbearing left lower extremity 8 weeks total with posterior hip precautions Diet: diabetic diet Wound Care: keep wound clean and dry Functional status:  ___ No restrictions     ___ Walk up steps independently ___ 24/7 supervision/assistance   ___ Walk up steps with assistance ___ Intermittent supervision/assistance  ___ Bathe/dress independently ___ Walk with walker     _x__ Bathe/dress with assistance ___ Walk Independently    ___ Shower independently ___ Walk with assistance    ___ Shower with assistance ___ No alcohol     ___ Return to work/school ________   COMMUNITY REFERRALS UPON DISCHARGE:    Home Health:   PT     OT    RN                     Agency:  [facility] Phone: [phone]   Medical Equipment/Items Ordered: rolling walker, bedside commode                                                     Agency/Supplier:  [facility] [phone]      Special Instructions: Home health nurse to check INR on 09/07/2015 with results to [facility] family practice [phone] fax number [phone]. Patient to remain on Coumadin for DVT prophylaxis until 10/19/2015 and stop   My questions have been answered and I understand these instructions. I will adhere to these goals and the provided educational materials after my discharge from the hospital.  Patient/Caregiver Signature _______________________________ Date __________  Clinician Signature _______________________________________ Date __________  Please bring this form and your medication list with you to all your follow-up doctor's appointments. Information on my medicine - Coumadin   (Warfarin)  This medication education was reviewed with me or my healthcare representative as part of my discharge preparation.   Why was  Coumadin prescribed for you? Coumadin was prescribed for you because you have a blood clot or a medical condition that can cause an increased risk of forming blood clots. Blood clots can cause serious health problems by blocking the flow of blood to the heart, lung, or brain. Coumadin can prevent harmful blood clots from forming. As a reminder your indication for Coumadin is:   Blood Clot Prevention After Orthopedic Surgery  What test will check on my response to Coumadin? While on Coumadin (warfarin) you will need to have an INR test regularly to ensure that your dose is keeping you in the desired range. The INR (international normalized ratio) number is calculated from the result of the laboratory test called prothrombin time (PT).  If an INR APPOINTMENT HAS NOT ALREADY BEEN MADE FOR YOU please schedule an appointment to have this lab work done by your health care provider within 7 days. Your INR goal is usually a number between:  2 to 3 or your provider may give you a more narrow range like 2-2.5.  Ask your health care provider during an office visit what your goal INR is.  What  do you need to  know  About  COUMADIN? Take Coumadin (warfarin) exactly as prescribed by your healthcare provider about the same time each day.  DO NOT stop taking without talking to the  doctor who prescribed the medication.  Stopping without other blood clot prevention medication to take the place of Coumadin may increase your risk of developing a new clot or stroke.  Get refills before you run out.  What do you do if you miss a dose? If you miss a dose, take it as soon as you remember on the same day then continue your regularly scheduled regimen the next day.  Do not take two doses of Coumadin at the same time.  Important Safety Information A possible side effect of Coumadin (Warfarin) is an increased risk of bleeding. You should call your healthcare provider right away if you experience any of the  following: ? Bleeding from an injury or your nose that does not stop. ? Unusual colored urine (red or dark brown) or unusual colored stools (red or black). ? Unusual bruising for unknown reasons. ? A serious fall or if you hit your head (even if there is no bleeding).  Some foods or medicines interact with Coumadin (warfarin) and might alter your response to warfarin. To help avoid this: ? Eat a balanced diet, maintaining a consistent amount of Vitamin K. ? Notify your provider about major diet changes you plan to make. ? Avoid alcohol or limit your intake to 1 drink for women and 2 drinks for men per day. (1 drink is 5 oz. wine, 12 oz. beer, or 1.5 oz. liquor.)  Make sure that ANY health care provider who prescribes medication for you knows that you are taking Coumadin (warfarin).  Also make sure the healthcare provider who is monitoring your Coumadin knows when you have started a new medication including herbals and non-prescription products.  Coumadin (Warfarin)  Major Drug Interactions  Increased Warfarin Effect Decreased Warfarin Effect  Alcohol (large quantities) Antibiotics (esp. Septra/Bactrim, Flagyl, Cipro) Amiodarone (Cordarone) Aspirin (ASA) Cimetidine (Tagamet) Megestrol (Megace) NSAIDs (ibuprofen, naproxen, etc.) Piroxicam (Feldene) Propafenone (Rythmol SR) Propranolol (Inderal) Isoniazid (INH) Posaconazole (Noxafil) Barbiturates (Phenobarbital) Carbamazepine (Tegretol) Chlordiazepoxide (Librium) Cholestyramine (Questran) Griseofulvin Oral Contraceptives Rifampin Sucralfate (Carafate) Vitamin K   Coumadin (Warfarin) Major Herbal Interactions  Increased Warfarin Effect Decreased Warfarin Effect  Garlic Ginseng Ginkgo biloba Coenzyme Q10 Green tea St. Johns wort    Coumadin (Warfarin) FOOD Interactions  Eat a consistent number of servings per week of foods HIGH in Vitamin K (1 serving =  cup)  Collards (cooked, or boiled & drained) Kale (cooked, or  boiled & drained) Mustard greens (cooked, or boiled & drained) Parsley *serving size only =  cup Spinach (cooked, or boiled & drained) Swiss chard (cooked, or boiled & drained) Turnip greens (cooked, or boiled & drained)  Eat a consistent number of servings per week of foods MEDIUM-HIGH in Vitamin K (1 serving = 1 cup)  Asparagus (cooked, or boiled & drained) Broccoli (cooked, boiled & drained, or raw & chopped) Brussel sprouts (cooked, or boiled & drained) *serving size only =  cup Lettuce, raw (green leaf, endive, romaine) Spinach, raw Turnip greens, raw & chopped   These websites have more information on Coumadin (warfarin):  FailFactory.se; VeganReport.com.au;

## 2015-09-03 ENCOUNTER — Inpatient Hospital Stay (HOSPITAL_COMMUNITY): Payer: Self-pay

## 2015-09-03 ENCOUNTER — Inpatient Hospital Stay (HOSPITAL_COMMUNITY): Payer: No Typology Code available for payment source | Admitting: Speech Pathology

## 2015-09-03 ENCOUNTER — Inpatient Hospital Stay (HOSPITAL_COMMUNITY): Payer: Self-pay | Admitting: Physical Therapy

## 2015-09-03 DIAGNOSIS — S32402D Unspecified fracture of left acetabulum, subsequent encounter for fracture with routine healing: Secondary | ICD-10-CM

## 2015-09-03 LAB — GLUCOSE, CAPILLARY
GLUCOSE-CAPILLARY: 101 mg/dL — AB (ref 65–99)
Glucose-Capillary: 91 mg/dL (ref 65–99)
Glucose-Capillary: 101 mg/dL — ABNORMAL HIGH (ref 65–99)
Glucose-Capillary: 97 mg/dL (ref 65–99)

## 2015-09-03 LAB — PROTIME-INR
INR: 2.66
Prothrombin Time: 28.9 seconds — ABNORMAL HIGH (ref 11.4–15.2)

## 2015-09-03 MED ORDER — OMEPRAZOLE 20 MG PO CPDR: 20.0000 mg | DELAYED_RELEASE_CAPSULE | ORAL | 0 refills | Status: AC | PRN

## 2015-09-03 MED ORDER — TRAMADOL HCL 50 MG PO TABS: 50.0000 mg | ORAL_TABLET | Freq: Three times a day (TID) | ORAL | 0 refills | Status: DC

## 2015-09-03 MED ORDER — OXYCODONE HCL ER 10 MG PO T12A: 10.0000 mg | EXTENDED_RELEASE_TABLET | Freq: Two times a day (BID) | ORAL | 0 refills | Status: DC

## 2015-09-03 MED ORDER — ATORVASTATIN CALCIUM 20 MG PO TABS: 20.0000 mg | ORAL_TABLET | Freq: Every day | ORAL | 5 refills | Status: AC

## 2015-09-03 MED ORDER — POLYSACCHARIDE IRON COMPLEX 150 MG PO CAPS: 150.0000 mg | ORAL_CAPSULE | Freq: Two times a day (BID) | ORAL | 1 refills | Status: DC

## 2015-09-03 MED ORDER — OXYCODONE HCL 10 MG PO TABS: 10.0000 mg | ORAL_TABLET | ORAL | 0 refills | Status: DC | PRN

## 2015-09-03 MED ORDER — METFORMIN HCL 500 MG PO TABS: 500.0000 mg | ORAL_TABLET | Freq: Two times a day (BID) | ORAL | 1 refills | Status: AC

## 2015-09-03 MED ORDER — HYDROCHLOROTHIAZIDE 25 MG PO TABS: 25.0000 mg | ORAL_TABLET | Freq: Every day | ORAL | 1 refills | Status: AC

## 2015-09-03 MED ORDER — WARFARIN SODIUM 5 MG PO TABS
5.0000 mg | ORAL_TABLET | Freq: Once | ORAL | Status: AC
  Administered 2015-09-03: 5 mg via ORAL
  Filled 2015-09-03: qty 1

## 2015-09-03 MED ORDER — IRBESARTAN 300 MG PO TABS: 300.0000 mg | ORAL_TABLET | Freq: Every day | ORAL | 1 refills | Status: AC

## 2015-09-03 MED ORDER — WARFARIN SODIUM 5 MG PO TABS: 5.0000 mg | ORAL_TABLET | Freq: Every day | ORAL | Status: DC

## 2015-09-03 MED ORDER — METHOCARBAMOL 500 MG PO TABS: 500.0000 mg | ORAL_TABLET | Freq: Four times a day (QID) | ORAL | 0 refills | Status: DC | PRN

## 2015-09-03 MED ORDER — AMLODIPINE BESYLATE 10 MG PO TABS: 10.0000 mg | ORAL_TABLET | Freq: Every day | ORAL | 3 refills | Status: AC

## 2015-09-03 MED ORDER — POLYETHYLENE GLYCOL 3350 17 G PO PACK: 17.0000 g | PACK | Freq: Two times a day (BID) | ORAL | 0 refills | Status: DC

## 2015-09-03 MED ORDER — WARFARIN SODIUM 5 MG PO TABS: ORAL_TABLET | ORAL | 0 refills | Status: DC

## 2015-09-03 NOTE — Progress Notes (Signed)
Speech Language Pathology Session Note & Discharge Summary  Patient Details  Name: [name] MRN: [id number] Date of Birth: 09-30-1960  Today's Date: 09/03/2015 SLP Individual Time: 0930-1025 SLP Individual Time Calculation (min): 55 min    Skilled Therapeutic Interventions:  Skilled treatment session focused on cognitive goals. SLP facilitated session by completing patient education in regards to utilization of memory strategies to utilize at home to maximize recall and carryover of functional information. Patient was Mod I for providing examples for ways to incorporate strategies at home and for overall anticipatory awareness. Patient left upright in wheelchair with all needs within reach.  Patient has met 4 of 4 long term goals.  Patient to discharge at overall Modified Independent level.   Reasons goals not met: N/A   Clinical Impression/Discharge Summary: Patient has made excellent gains and has met 4 of 4 LTG's this admission due to improved cognitive functioning. Currently, patient demonstrates behaviors consistent with a Rancho Level VIII and is overall Mod I to complete functional and familiar tasks safely in regards to problem solving, attention, recall and awareness. Patient education is complete and patient will discharge home with intermittent assistance from family. Suspect patient is at his cognitive baseline, therefore, skilled SLP f/u is not warranted at this time.   Recommendation:  None      Equipment: N/A   Reasons for discharge: Treatment goals met;Discharged from hospital   Patient/Family Agrees with Progress Made and Goals Achieved: Yes   Function:   Cognition Comprehension Comprehension assist level: Follows complex conversation/direction with no assist  Expression   Expression assist level: Expresses complex ideas: With no assist  Social Interaction Social Interaction assist level: Interacts appropriately with others - No medications needed.  Problem  Solving Problem solving assist level: Solves complex problems: With extra time  Memory Memory assist level: More than reasonable amount of time;Assistive device: No helper   Dhanush Jokerst 09/03/2015, 10:32 AM

## 2015-09-03 NOTE — Progress Notes (Signed)
ANTICOAGULATION CONSULT NOTE - Follow Up Consult  Pharmacy Consult for coumadin Indication: VTE prophylaxis  Allergies  Allergen Reactions  . Celecoxib Palpitations  . Codeine Sulfate Nausea And Vomiting and Other (See Comments)    MIGRAINE  . Sulfa Antibiotics Other (See Comments)    False hep reading    Patient Measurements: Height: 5\' 6"  (167.6 cm) Weight:  (need to zero bed during therapy eval next AM) IBW/kg (Calculated) : 63.8 Heparin Dosing Weight:   Vital Signs: Temp: 98.5 F (36.9 C) (08/25 0556) Temp Source: Oral (08/25 0556) BP: 140/82 (08/25 0556) Pulse Rate: 92 (08/25 0556)  Labs:  Recent Labs  09/01/15 0451 09/02/15 0542  LABPROT 24.8* 27.1*  INR 2.20 2.45    Estimated Creatinine Clearance: 95 mL/min (by C-G formula based on SCr of 0.96 mg/dL).   Medications:  Scheduled:  . amLODipine  10 mg Oral Daily  . atorvastatin  20 mg Oral Daily  . bacitracin   Topical BID  . hydrochlorothiazide  25 mg Oral Daily  . irbesartan  300 mg Oral Daily  . iron polysaccharides  150 mg Oral q12n4p  . metFORMIN  500 mg Oral BID WC  . oxyCODONE  10 mg Oral Q12H  . pantoprazole  40 mg Oral Daily  . polyethylene glycol  17 g Oral BID  . traMADol  50 mg Oral TID WC & HS  . Warfarin - Pharmacist Dosing Inpatient   Does not apply q1800   Infusions:    Assessment: 55 yo male is currently on therapeutic coumadin for VTE prophylaxis.  INR today is up to 2.66.  Goal of Therapy:  INR 2-3 Monitor platelets by anticoagulation protocol: Yes   Plan:  - coumadin 5 mg po x1 - INR in am  Stacey Maura, Tsz-Yin 09/03/2015,8:28 AM

## 2015-09-03 NOTE — Progress Notes (Signed)
Physical Therapy Discharge Summary  Patient Details  Name: [name] MRN: [id number] Date of Birth: [birth year]  Today's Date: 09/03/2015 PT Individual Time: 1115-1200  PT Individual Time Calculation (min): 45 min     Patient has met 8 of 8 long term goals due to improved activity tolerance, improved balance, improved postural control, increased strength, increased range of motion, decreased pain and ability to compensate for deficits.  Patient to discharge at an ambulatory level Modified Independent.   Patient's care partner is independent to provide the necessary physical assistance at discharge.    Recommendation:  Patient will benefit from ongoing skilled PT services in home health setting to continue to advance safe functional mobility, address ongoing impairments in balance, gait, transfers, and minimize fall risk.  Equipment: Wheelchair and RW  Reasons for discharge: treatment goals met and discharge from hospital  Patient/family agrees with progress made and goals achieved: Yes   PT Treatment:  Patient instructed in grad day assessment; see below for results.  PT also instructed patient in car transfer with supervision A and min cues for UE positioning on stable surface rather than door.  Gait training on level surface complete as listed below and on unlevel surface with RW and supervision A for 59f.   Following treatment patient returned to bed with sit>supine transfer without assist or cues from PT and left with call bell in reach.   PT Discharge Precautions/RestrictionsRestrictions Weight Bearing Restrictions: Yes LLE Weight Bearing: Touchdown weight bearing Vital Signs   Pain Pain Assessment Pain Assessment: No/denies pain Pain Score: 0-No pain Vision/Perception     Cognition Overall Cognitive Status: Within Functional Limits for tasks assessed Arousal/Alertness: Awake/alert Orientation Level: Oriented X4 Attention: Alternating Alternating Attention:  Appears intact Alternating Attention Impairment: Functional complex Memory: Appears intact Awareness: Appears intact Problem Solving: Appears intact Self Monitoring: Appears intact Self Correcting: Appears intact Safety/Judgment: Appears intact Sensation Sensation Light Touch: Appears Intact Stereognosis: Appears Intact Hot/Cold: Appears Intact Proprioception: Appears Intact Coordination Gross Motor Movements are Fluid and Coordinated: No Fine Motor Movements are Fluid and Coordinated: Yes Coordination and Movement Description: pain limits full coordination in the LLE.  Motor  Motor Motor: Within Functional Limits Motor - Skilled Clinical Observations: mild weekness in the LLE.   Mobility Bed Mobility Bed Mobility: Rolling Right;Rolling Left;Supine to Sit;Sit to Supine Rolling Right: 6: Modified independent (Device/Increase time) Rolling Left: 5: Supervision Supine to Sit: 6: Modified independent (Device/Increase time) Sit to Supine: 6: Modified independent (Device/Increase time) Transfers Transfers: Yes Sit to Stand: 6: Modified independent (Device/Increase time) Stand to Sit: 6: Modified independent (Device/Increase time) Stand Pivot Transfers: 6: Modified independent (Device/Increase time) Locomotion  Ambulation Ambulation: Yes Ambulation/Gait Assistance: 6: Modified independent (Device/Increase time) Ambulation Distance (Feet): 120 Feet Assistive device: Rolling walker Gait Gait: Yes Gait Pattern: Impaired Gait Pattern: Step-to pattern Stairs / Additional Locomotion Stairs: Yes Stairs Assistance: 5: Supervision Stair Management Technique: With walker Number of Stairs: 1 Height of Stairs: 6 Curb: 5: SPsychiatric nurse Yes Wheelchair Assistance: 6: Modified independent (Device/Increase time) WEnvironmental health practitioner Both upper extremities Wheelchair Parts Management: Independent Distance: 2558f Trunk/Postural Assessment   Cervical Assessment Cervical Assessment: Within Functional Limits Thoracic Assessment Thoracic Assessment: Within Functional Limits Lumbar Assessment Lumbar Assessment: Within Functional Limits Postural Control Postural Control: Within Functional Limits Protective Responses: limited by TDWB, LLE pain and weakness  Balance Static Sitting Balance Static Sitting - Level of Assistance: 7: Independent Dynamic Sitting Balance Dynamic Sitting - Level of Assistance: 7: Independent Static  Standing Balance Static Standing - Level of Assistance: 6: Modified independent (Device/Increase time) (RW support) Dynamic Standing Balance Dynamic Standing - Level of Assistance: 5: Stand by assistance (RW support ) Extremity Assessment      RLE Assessment RLE Assessment: Within Functional Limits RLE Strength RLE Overall Strength Comments: grossly 4+/5 throughout LLE Assessment LLE Assessment: Exceptions to WFL LLE AROM (degrees) Overall AROM Left Lower Extremity: Other (Comment) (Posterior hip precautions. ) LLE Overall AROM Comments: knee and ankle WFL  LLE Strength LLE Overall Strength: Due to pain;Deficits LLE Overall Strength Comments: 4-/5 throughout with slight increase in pain at surgical site.        See Function Navigator for Current Functional Status.  Lorie Phenix 09/03/2015, 5:59 PM

## 2015-09-03 NOTE — Progress Notes (Addendum)
Occupational Therapy Session Note  Patient Details  Name: [name] MRN: [id number] Date of Birth: [birth year]  Today's Date: 09/03/2015 OT Individual Time: 0800-0930 OT Individual Time Calculation (min): 90 min     Short Term Goals: Week 1:  OT Short Term Goal 1 (Week 1): Pt will don pants with AE with S. OT Short Term Goal 2 (Week 1): Pt will don socks with AE with S. OT Short Term Goal 3 (Week 1): Pt will sit to EOB with S to prepare for transfers. OT Short Term Goal 4 (Week 1): Pt will stand with RW to don pants over hips with S.  Skilled Therapeutic Interventions/Progress Updates:    Pt engaged in BADL retraining including bathing at shower level and dressing with sit<>stand from EOB.  Pt amb with RW in room to gather all supplies and clothing.  Pt utilized AE appropriately to complete bathing and dressing tasks.  Pt completed all BADLs (bathing, dressing, toileting, functional transfers) at mod I level.  Pt also engaged in functional amb with RW for simple home mgmt tasks.  Pt engaged in dynamic standing tasks. Pt pleased with progress and ready for discharge tomorrow.    Therapy Documentation Precautions:  Precautions Precautions: Fall, Posterior Hip Precaution Comments: recalled 3/3 hip precautions Restrictions Weight Bearing Restrictions: Yes LLE Weight Bearing: Touchdown weight bearing Pain:  Pt denied pain ADL: ADL ADL Comments: refer to functional navigator  See Function Navigator for Current Functional Status.   Therapy/Group: Individual Therapy  Leroy Libman 09/03/2015, 9:33 AM

## 2015-09-03 NOTE — Progress Notes (Signed)
Occupational Therapy Discharge Summary  Patient Details  Name: [name] MRN: [id number] Date of Birth: 1960/07/13     Patient has met 9 of 9 long term goals due to improved activity tolerance, improved balance, postural control, ability to compensate for deficits and improved coordination.  Pt made excellent progress with BADLs and IADLs during this admission.  Pt completes all BADLs and simple meal prep at mod I level.  Family has not been present for education.  Pt pleased with progress Patient to discharge at overall Modified Independent level.  Patient's care partner is independent to provide the necessary physical assistance at discharge; as pt can direct his care regarding IADLs.    Recommendation:  Patient will benefit from ongoing skilled OT services in home health setting to continue to advance functional skills in the area of BADL, iADL and Reduce care partner burden.  Equipment: BSC  Reasons for discharge: treatment goals met and discharge from hospital  Patient/family agrees with progress made and goals achieved: Yes  OT Discharge   ADL ADL ADL Comments: refer to functional navigator Vision/Perception  Vision- History Baseline Vision/History: Wears glasses Wears Glasses: Reading only Patient Visual Report: No change from baseline Vision- Assessment Vision Assessment?: No apparent visual deficits  Cognition Overall Cognitive Status: Within Functional Limits for tasks assessed Arousal/Alertness: Awake/alert Orientation Level: Oriented X4 Attention: Alternating Alternating Attention: Appears intact Alternating Attention Impairment: Functional complex Memory: Appears intact Awareness: Appears intact Problem Solving: Appears intact Reasoning: Appears intact Self Monitoring: Appears intact Self Correcting: Appears intact Safety/Judgment: Appears intact Sensation Sensation Light Touch: Appears Intact Stereognosis: Appears Intact Hot/Cold: Appears  Intact Proprioception: Appears Intact    Trunk/Postural Assessment  Cervical Assessment Cervical Assessment: Within Functional Limits Thoracic Assessment Thoracic Assessment: Within Functional Limits Lumbar Assessment Lumbar Assessment: Within Functional Limits Postural Control Postural Control: Within Functional Limits Protective Responses: limited by TDWB, LLE pain and weakness    Extremity/Trunk Assessment RUE Assessment RUE Assessment: Within Functional Limits LUE Assessment LUE Assessment: Within Functional Limits   See Function Navigator for Current Functional Status.  Leotis Shames [facility] 09/03/2015, 9:31 AM
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
## 2015-09-04 DIAGNOSIS — K5903 Drug induced constipation: Secondary | ICD-10-CM

## 2015-09-04 DIAGNOSIS — K5909 Other constipation: Secondary | ICD-10-CM

## 2015-09-04 DIAGNOSIS — S32409A Unspecified fracture of unspecified acetabulum, initial encounter for closed fracture: Secondary | ICD-10-CM

## 2015-09-04 DIAGNOSIS — S069X9S Unspecified intracranial injury with loss of consciousness of unspecified duration, sequela: Secondary | ICD-10-CM

## 2015-09-04 DIAGNOSIS — E119 Type 2 diabetes mellitus without complications: Secondary | ICD-10-CM

## 2015-09-04 DIAGNOSIS — E876 Hypokalemia: Secondary | ICD-10-CM

## 2015-09-04 LAB — PROTIME-INR
INR: 2.77
Prothrombin Time: 29.8 s — ABNORMAL HIGH (ref 11.4–15.2)

## 2015-09-04 LAB — GLUCOSE, CAPILLARY: GLUCOSE-CAPILLARY: 99 mg/dL (ref 65–99)

## 2015-09-04 NOTE — Progress Notes (Signed)
Patient discharged home with spouse, no questions at this time. Able to verbalize hip precautions. Belongings packed, escorted to car with NT.
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
## 2015-09-17 DIAGNOSIS — M898X9 Other specified disorders of bone, unspecified site: Secondary | ICD-10-CM | POA: Insufficient documentation

## 2015-09-17 NOTE — Progress Notes (Signed)
  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  Identity was confirmed.  All relevant records and images related to the planned course of therapy were reviewed.   Written consent to proceed with treatment was confirmed which was freely given after reviewing the details related to the planned course of therapy had been reviewed with the patient.  Then, the patient was set-up in a stable reproducible supine position for radiation therapy.  Xrays were obtained of the target hip area.     The images were reviewed and 2 treatment fields were designed and positioned to treat the appropriate target region. A simple isodose plan is requested.  PLAN:  The patient will receive 7 Gy in 1 fraction.    Simulation verification Port films were taken prior to treatment. Each of the treatment fields was reviewed and appropriately delineates the target regions and the patient was appropriate to proceed with radiation treatment.  ________________________________   Jodelle Gross, MD, PhD
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
## 2015-09-17 NOTE — Progress Notes (Signed)
  Radiation Oncology         [phone] ________________________________  Name: [name] MRN: [id number]  Date: 08/25/2015  DOB: Jul 18, 1960  End of Treatment Note  Diagnosis:   Postoperative heterotopic ossification     Indication for treatment:  curative       Radiation treatment dates:   08/25/2015  Site/dose:   The patient was treated to a dose of 7 Gy to the left hip. This was accomplished with AP and PA fields.  Narrative: The patient tolerated radiation treatment relatively well.   No difficulties.  Plan: The patient has completed radiation treatment. The patient will return to radiation oncology clinic on an as needed basis. ________________________________  Jodelle Gross, M.D., Ph.D.

## 2015-09-20 ENCOUNTER — Inpatient Hospital Stay: Payer: Self-pay | Admitting: Physical Medicine & Rehabilitation

## 2015-09-27 DIAGNOSIS — E119 Type 2 diabetes mellitus without complications: Secondary | ICD-10-CM

## 2015-09-27 DIAGNOSIS — I10 Essential (primary) hypertension: Secondary | ICD-10-CM

## 2015-09-27 DIAGNOSIS — K760 Fatty (change of) liver, not elsewhere classified: Secondary | ICD-10-CM

## 2015-09-27 DIAGNOSIS — S32402D Unspecified fracture of left acetabulum, subsequent encounter for fracture with routine healing: Secondary | ICD-10-CM

## 2015-09-29 ENCOUNTER — Encounter: Payer: Self-pay | Admitting: Physical Medicine & Rehabilitation

## 2015-09-29 ENCOUNTER — Encounter: Payer: Self-pay | Attending: Physical Medicine & Rehabilitation | Admitting: Physical Medicine & Rehabilitation

## 2015-09-29 VITALS — BP 112/75 | HR 93 | Resp 14

## 2015-09-29 DIAGNOSIS — E785 Hyperlipidemia, unspecified: Secondary | ICD-10-CM | POA: Insufficient documentation

## 2015-09-29 DIAGNOSIS — D62 Acute posthemorrhagic anemia: Secondary | ICD-10-CM

## 2015-09-29 DIAGNOSIS — S069X0S Unspecified intracranial injury without loss of consciousness, sequela: Secondary | ICD-10-CM

## 2015-09-29 DIAGNOSIS — S32492S Other specified fracture of left acetabulum, sequela: Secondary | ICD-10-CM

## 2015-09-29 DIAGNOSIS — E119 Type 2 diabetes mellitus without complications: Secondary | ICD-10-CM | POA: Insufficient documentation

## 2015-09-29 DIAGNOSIS — S32492A Other specified fracture of left acetabulum, initial encounter for closed fracture: Secondary | ICD-10-CM | POA: Insufficient documentation

## 2015-09-29 DIAGNOSIS — I10 Essential (primary) hypertension: Secondary | ICD-10-CM | POA: Insufficient documentation

## 2015-09-29 DIAGNOSIS — S066X9A Traumatic subarachnoid hemorrhage with loss of consciousness of unspecified duration, initial encounter: Secondary | ICD-10-CM | POA: Insufficient documentation

## 2015-09-29 DIAGNOSIS — R3129 Other microscopic hematuria: Secondary | ICD-10-CM | POA: Insufficient documentation

## 2015-09-29 DIAGNOSIS — K219 Gastro-esophageal reflux disease without esophagitis: Secondary | ICD-10-CM | POA: Insufficient documentation

## 2015-09-29 DIAGNOSIS — E669 Obesity, unspecified: Secondary | ICD-10-CM | POA: Insufficient documentation

## 2015-09-29 MED ORDER — TRAMADOL HCL 50 MG PO TABS: 50.0000 mg | ORAL_TABLET | Freq: Four times a day (QID) | ORAL | 1 refills | Status: DC | PRN

## 2015-09-29 MED ORDER — WARFARIN SODIUM 5 MG PO TABS: ORAL_TABLET | ORAL | 1 refills | Status: DC
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
## 2015-09-29 NOTE — Patient Instructions (Signed)
PLEASE CALL ME WITH ANY PROBLEMS OR QUESTIONS ([phone])  

## 2015-10-06 ENCOUNTER — Telehealth: Payer: Self-pay | Admitting: Physical Medicine & Rehabilitation

## 2015-10-06 NOTE — Telephone Encounter (Signed)
Jamie RN with [facility] needs to get verbal order to discharge patient to check incision area.  Her voicemail is secure and leave a message if she doesn't answer [phone].

## 2015-10-06 NOTE — Telephone Encounter (Signed)
Verbal orders given  

## 2015-12-01 ENCOUNTER — Encounter: Payer: Self-pay | Attending: Physical Medicine & Rehabilitation | Admitting: Physical Medicine & Rehabilitation

## 2015-12-01 ENCOUNTER — Encounter: Payer: Self-pay | Admitting: Physical Medicine & Rehabilitation

## 2015-12-01 VITALS — BP 130/88 | HR 99 | Resp 16

## 2015-12-01 DIAGNOSIS — K219 Gastro-esophageal reflux disease without esophagitis: Secondary | ICD-10-CM | POA: Insufficient documentation

## 2015-12-01 DIAGNOSIS — E669 Obesity, unspecified: Secondary | ICD-10-CM | POA: Insufficient documentation

## 2015-12-01 DIAGNOSIS — S069X0S Unspecified intracranial injury without loss of consciousness, sequela: Secondary | ICD-10-CM

## 2015-12-01 DIAGNOSIS — S066X9A Traumatic subarachnoid hemorrhage with loss of consciousness of unspecified duration, initial encounter: Secondary | ICD-10-CM | POA: Insufficient documentation

## 2015-12-01 DIAGNOSIS — E785 Hyperlipidemia, unspecified: Secondary | ICD-10-CM | POA: Insufficient documentation

## 2015-12-01 DIAGNOSIS — I10 Essential (primary) hypertension: Secondary | ICD-10-CM | POA: Insufficient documentation

## 2015-12-01 DIAGNOSIS — D62 Acute posthemorrhagic anemia: Secondary | ICD-10-CM | POA: Insufficient documentation

## 2015-12-01 DIAGNOSIS — E119 Type 2 diabetes mellitus without complications: Secondary | ICD-10-CM | POA: Insufficient documentation

## 2015-12-01 DIAGNOSIS — R3129 Other microscopic hematuria: Secondary | ICD-10-CM | POA: Insufficient documentation

## 2015-12-01 DIAGNOSIS — S32492A Other specified fracture of left acetabulum, initial encounter for closed fracture: Secondary | ICD-10-CM | POA: Insufficient documentation

## 2015-12-01 DIAGNOSIS — S32492S Other specified fracture of left acetabulum, sequela: Secondary | ICD-10-CM

## 2015-12-01 MED ORDER — MELOXICAM 7.5 MG PO TABS: 7.5000 mg | ORAL_TABLET | Freq: Every day | ORAL | 4 refills | Status: DC

## 2015-12-01 NOTE — Patient Instructions (Signed)
Continue using your crutches for now. Focus on good mechanics and balance.  Work on stretching your hip and range of motion up to the limits advised by Dr. Marcelino Scot
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
## 2015-12-10 ENCOUNTER — Ambulatory Visit: Payer: No Typology Code available for payment source | Attending: Physical Medicine & Rehabilitation

## 2015-12-10 DIAGNOSIS — R29810 Facial weakness: Secondary | ICD-10-CM | POA: Diagnosis present

## 2015-12-10 DIAGNOSIS — R2689 Other abnormalities of gait and mobility: Secondary | ICD-10-CM | POA: Diagnosis not present

## 2015-12-10 DIAGNOSIS — M25552 Pain in left hip: Secondary | ICD-10-CM | POA: Diagnosis present
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
## 2015-12-21 ENCOUNTER — Ambulatory Visit: Payer: No Typology Code available for payment source | Admitting: Physical Therapy

## 2015-12-21 DIAGNOSIS — M25552 Pain in left hip: Secondary | ICD-10-CM

## 2015-12-21 DIAGNOSIS — R2689 Other abnormalities of gait and mobility: Secondary | ICD-10-CM | POA: Diagnosis not present
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
## 2015-12-21 NOTE — Patient Instructions (Signed)
  Hamstring Step 2    Left foot relaxed, knee straight, other leg bent, foot flat. Raise straight leg further upward to maximal range. Hold _30__ seconds. Relax leg completely down. Repeat __3_ times.  Perform 2-3 sessions per day.  Copyright  VHI. All rights reserved.    Hamstring Stretch - Supine    Lie on back, left knee bent with towel around knee. Pull thigh toward chest until a stretch is felt on back of thigh. Hold for _30__ seconds. Repeat _3 times. Do _2-3__ times per day.  Copyright  VHI. All rights reserved.    Hip Flexor Stretch    Lying on back near edge of bed, bend one leg, foot flat. Hang other leg over edge, relaxed, thigh resting entirely on bed for __30__ seconds. Repeat __3__ times. Do _2-3___ sessions per day. Advanced Exercise: Bend knee back keeping thigh in contact with bed.  [email or web address]://[email or web address].us/346   Copyright  VHI. All rights reserved.   Standing Marching   Using a chair if necessary, march in place. Repeat __15__ times. Do __2-3__ sessions per day.  [email or web address]://[email or web address].[email or web address]   Copyright  VHI. All rights reserved.   Hip Backward Kick   Using a chair for balance, keep legs shoulder width apart and toes pointed for- ward. Slowly extend one leg back, keeping knee straight. Do not lean forward. Repeat with other leg. Repeat __15__ times. Do _2-3___ sessions per day.  [email or web address]://[email or web address].[email or web address]   Copyright  VHI. All rights reserved.   Hip Side Kick   Holding a chair for balance, keep legs shoulder width apart and toes pointed forward. Swing a leg out to side, keeping knee straight. Do not lean. Repeat using other leg. Repeat _15___ times. Do _2-3___ sessions per day.  [email or web address]://[email or web address].[email or web address]   Copyright  VHI. All rights reserved.

## 2015-12-24 ENCOUNTER — Ambulatory Visit: Payer: No Typology Code available for payment source | Admitting: Rehabilitation

## 2015-12-24 ENCOUNTER — Encounter: Payer: Self-pay | Admitting: Rehabilitation

## 2015-12-24 DIAGNOSIS — R2689 Other abnormalities of gait and mobility: Secondary | ICD-10-CM

## 2015-12-24 DIAGNOSIS — M25552 Pain in left hip: Secondary | ICD-10-CM
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
## 2015-12-28 ENCOUNTER — Ambulatory Visit: Payer: No Typology Code available for payment source

## 2015-12-28 DIAGNOSIS — R2689 Other abnormalities of gait and mobility: Secondary | ICD-10-CM

## 2015-12-28 DIAGNOSIS — M25552 Pain in left hip: Secondary | ICD-10-CM
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
## 2015-12-28 NOTE — Patient Instructions (Signed)
HIP: Hamstrings - Short Sitting    Rest left leg on raised surface or flat on the floor. Keep knee straight. Lift chest. Hold _30__ seconds. _2-3__ reps per set, _2__ sets per day, __7_ days per week  Copyright  VHI. All rights reserved.    Hamstring Stretch - Supine    Lie on back, left knee bent with towel around knee. Pull thigh toward chest until a stretch is felt on back of thigh. Hold for _30__ seconds. Repeat _3 times. Do _2-3__ times per day.  Copyright  VHI. All rights reserved.    Hip Flexor Stretch    Lying on back near edge of bed, bend one leg, foot flat. Hang other leg over edge, relaxed, thigh resting entirely on bed for __30__ seconds. Repeat __3__ times. Do _2-3___ sessions per day. Advanced Exercise: Bend knee back keeping thigh in contact with bed. You can also try holding stretch for 1-2 minutes, once, twice a day. Perform every day. http://[email or web address].exer.us/346   Piriformis Stretch - Supine    Pull left knee across body toward opposite shoulder. You can use a towel or sheet under left knee and use towel to bring knee towards shoulder. Hold slight stretch for _30__ seconds. Repeat _3__ times. Do _2__ times per day.  Copyright  VHI. All rights reserved.

## 2015-12-31 ENCOUNTER — Ambulatory Visit: Payer: No Typology Code available for payment source

## 2015-12-31 DIAGNOSIS — R2689 Other abnormalities of gait and mobility: Secondary | ICD-10-CM | POA: Diagnosis not present

## 2015-12-31 DIAGNOSIS — M25552 Pain in left hip: Secondary | ICD-10-CM
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
## 2016-01-04 ENCOUNTER — Ambulatory Visit: Payer: No Typology Code available for payment source

## 2016-01-04 DIAGNOSIS — M25552 Pain in left hip: Secondary | ICD-10-CM

## 2016-01-04 DIAGNOSIS — R2689 Other abnormalities of gait and mobility: Secondary | ICD-10-CM
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
## 2016-01-04 NOTE — Patient Instructions (Addendum)
HIP: Hamstrings - Short Sitting    Rest left leg on raised surface or flat on the floor. Keep knee straight. Lift chest. Hold _30__ seconds. _2-3__ reps per set, _2-3__ sets per day, __7_ days per week  Copyright  VHI. All rights reserved.    Hamstring Stretch - Supine    Lie on back, left knee bent with towel around knee. Pull thigh toward chest until a stretch is felt on back of thigh. Hold for _30__ seconds. Repeat _3 times. Do _2-3__ times per day.  Copyright  VHI. All rights reserved.   Hip Flexor Stretch    Lying on back near edge of bed, bend one leg, foot flat. Hang other leg over edge, relaxed, thigh resting entirely on bed for __30__ seconds. Repeat __3__ times. Do _2-3___ sessions per day. Advanced Exercise: Bend knee back keeping thigh in contact with bed, you can rest your left foot on a book or stool and then bend knee further to improve stretch.You can also try holding stretch for 1-2 minutes, once, twice a day. Perform every day. http://gt2.exer.us/346  Piriformis Stretch - Supine    Pull left knee across body toward opposite shoulder. You can use a towel or sheet under left knee and use towel to bring knee towards shoulder. Hold slight stretch for _30__ seconds. Repeat _3__ times. Do _2-3__ times per day.  Copyright  VHI. All rights reserved.   Standing Marching   Using a chair if necessary, march in place. Repeat __15__ times. Do __2-3__ sessions per day.  http://gt2.exer.us/344   Copyright  VHI. All rights reserved.   Hip Backward Kick   Using a chair for balance, keep legs shoulder width apart and toes pointed for- ward. Slowly extend one leg back, keeping knee straight. Do not lean forward. Repeat with other leg. Repeat __5__ reps, performed 3 sets. Perform every other day.  http://gt2.exer.us/340   Copyright  VHI. All rights reserved.   Hip Side Kick   Take half a step back with kicking leg. Holding a counter for balance,  keep legs shoulder width apart and toes pointed forward. Swing a leg out to side, keeping knee straight. Do not lean. Repeat using other leg. Repeat _5___ reps, perform 3 sets. Perform every other day.  http://gt2.exer.us/342   Copyright  VHI. All rights reserved.

## 2016-01-07 ENCOUNTER — Ambulatory Visit: Payer: No Typology Code available for payment source

## 2016-01-07 DIAGNOSIS — R2689 Other abnormalities of gait and mobility: Secondary | ICD-10-CM | POA: Diagnosis not present

## 2016-01-07 DIAGNOSIS — M25552 Pain in left hip: Secondary | ICD-10-CM
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
## 2016-01-11 ENCOUNTER — Ambulatory Visit: Payer: Self-pay

## 2016-01-14 ENCOUNTER — Ambulatory Visit: Payer: No Typology Code available for payment source | Attending: Physical Medicine & Rehabilitation

## 2016-01-14 DIAGNOSIS — M25552 Pain in left hip: Secondary | ICD-10-CM | POA: Insufficient documentation

## 2016-01-14 DIAGNOSIS — R2689 Other abnormalities of gait and mobility: Secondary | ICD-10-CM | POA: Insufficient documentation
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
## 2016-01-18 ENCOUNTER — Ambulatory Visit: Payer: No Typology Code available for payment source

## 2016-01-18 DIAGNOSIS — R2689 Other abnormalities of gait and mobility: Secondary | ICD-10-CM | POA: Diagnosis not present

## 2016-01-18 DIAGNOSIS — M25552 Pain in left hip: Secondary | ICD-10-CM
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
## 2016-01-28 ENCOUNTER — Ambulatory Visit: Payer: No Typology Code available for payment source | Admitting: Physical Therapy

## 2016-01-28 DIAGNOSIS — R2689 Other abnormalities of gait and mobility: Secondary | ICD-10-CM

## 2016-01-28 DIAGNOSIS — M25552 Pain in left hip: Secondary | ICD-10-CM
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
## 2016-02-02 ENCOUNTER — Encounter: Payer: Self-pay | Admitting: Physical Medicine & Rehabilitation

## 2016-02-02 ENCOUNTER — Encounter: Payer: Self-pay | Attending: Physical Medicine & Rehabilitation | Admitting: Physical Medicine & Rehabilitation

## 2016-02-02 VITALS — BP 137/91 | HR 113 | Resp 14

## 2016-02-02 DIAGNOSIS — S32492A Other specified fracture of left acetabulum, initial encounter for closed fracture: Secondary | ICD-10-CM | POA: Insufficient documentation

## 2016-02-02 DIAGNOSIS — S066X9A Traumatic subarachnoid hemorrhage with loss of consciousness of unspecified duration, initial encounter: Secondary | ICD-10-CM | POA: Insufficient documentation

## 2016-02-02 DIAGNOSIS — K219 Gastro-esophageal reflux disease without esophagitis: Secondary | ICD-10-CM | POA: Insufficient documentation

## 2016-02-02 DIAGNOSIS — E669 Obesity, unspecified: Secondary | ICD-10-CM | POA: Insufficient documentation

## 2016-02-02 DIAGNOSIS — E119 Type 2 diabetes mellitus without complications: Secondary | ICD-10-CM | POA: Insufficient documentation

## 2016-02-02 DIAGNOSIS — E785 Hyperlipidemia, unspecified: Secondary | ICD-10-CM | POA: Insufficient documentation

## 2016-02-02 DIAGNOSIS — D62 Acute posthemorrhagic anemia: Secondary | ICD-10-CM | POA: Insufficient documentation

## 2016-02-02 DIAGNOSIS — I10 Essential (primary) hypertension: Secondary | ICD-10-CM | POA: Insufficient documentation

## 2016-02-02 DIAGNOSIS — R3129 Other microscopic hematuria: Secondary | ICD-10-CM | POA: Insufficient documentation

## 2016-02-02 DIAGNOSIS — S069X0S Unspecified intracranial injury without loss of consciousness, sequela: Secondary | ICD-10-CM

## 2016-02-02 DIAGNOSIS — S32492S Other specified fracture of left acetabulum, sequela: Secondary | ICD-10-CM

## 2016-02-02 MED ORDER — DICLOFENAC SODIUM 75 MG PO TBEC: 75.0000 mg | DELAYED_RELEASE_TABLET | Freq: Two times a day (BID) | ORAL | 3 refills | Status: DC
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
## 2016-02-02 NOTE — Patient Instructions (Signed)
PLEASE FEEL FREE TO CALL OUR OFFICE WITH ANY PROBLEMS OR QUESTIONS ([phone])      

## 2016-02-04 ENCOUNTER — Ambulatory Visit: Payer: No Typology Code available for payment source | Admitting: Rehabilitation

## 2016-02-04 DIAGNOSIS — R2689 Other abnormalities of gait and mobility: Secondary | ICD-10-CM

## 2016-02-04 DIAGNOSIS — M25552 Pain in left hip: Secondary | ICD-10-CM
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
## 2016-02-11 ENCOUNTER — Encounter: Payer: Self-pay | Admitting: Rehabilitation

## 2016-02-11 ENCOUNTER — Ambulatory Visit
Payer: No Typology Code available for payment source | Attending: Physical Medicine & Rehabilitation | Admitting: Rehabilitation

## 2016-02-11 DIAGNOSIS — M25552 Pain in left hip: Secondary | ICD-10-CM

## 2016-02-11 DIAGNOSIS — R29810 Facial weakness: Secondary | ICD-10-CM | POA: Insufficient documentation

## 2016-02-11 DIAGNOSIS — R2689 Other abnormalities of gait and mobility: Secondary | ICD-10-CM | POA: Diagnosis present
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
## 2016-02-16 ENCOUNTER — Ambulatory Visit: Payer: No Typology Code available for payment source | Admitting: Rehabilitative and Restorative Service Providers"

## 2016-02-16 ENCOUNTER — Encounter: Payer: Self-pay | Admitting: Rehabilitative and Restorative Service Providers"

## 2016-02-16 ENCOUNTER — Other Ambulatory Visit: Payer: Self-pay | Admitting: Orthopedic Surgery

## 2016-02-16 DIAGNOSIS — R29810 Facial weakness: Secondary | ICD-10-CM

## 2016-02-16 DIAGNOSIS — T1490XA Injury, unspecified, initial encounter: Secondary | ICD-10-CM

## 2016-02-16 DIAGNOSIS — R2689 Other abnormalities of gait and mobility: Secondary | ICD-10-CM

## 2016-02-16 DIAGNOSIS — M25552 Pain in left hip: Secondary | ICD-10-CM
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
## 2016-02-16 NOTE — Patient Instructions (Signed)
Trigger Point Dry Needling  . What is Trigger Point Dry Needling (DN)? o DN is a physical therapy technique used to treat muscle pain and dysfunction. Specifically, DN helps deactivate muscle trigger points (muscle knots).  o A thin filiform needle is used to penetrate the skin and stimulate the underlying trigger point. The goal is for a local twitch response (LTR) to occur and for the trigger point to relax. No medication of any kind is injected during the procedure.   . What Does Trigger Point Dry Needling Feel Like?  o The procedure feels different for each individual patient. Some patients report that they do not actually feel the needle enter the skin and overall the process is not painful. Very mild bleeding may occur. However, many patients feel a deep cramping in the muscle in which the needle was inserted. This is the local twitch response.   Marland Kitchen How Will I feel after the treatment? o Soreness is normal, and the onset of soreness may not occur for a few hours. Typically this soreness does not last longer than two days.  o Bruising is uncommon, however; ice can be used to decrease any possible bruising.  o In rare cases feeling tired or nauseous after the treatment is normal. In addition, your symptoms may get worse before they get better, this period will typically not last longer than 24 hours.   . What Can I do After My Treatment? o Increase your hydration by drinking more water for the next 24 hours. o You may place ice or heat on the areas treated that have become sore, however, do not use heat on inflamed or bruised areas. Heat often brings more relief post needling. o You can continue your regular activities, but vigorous activity is not recommended initially after the treatment for 24 hours. o DN is best combined with other physical therapy such as strengthening, stretching, and other therapies.    TENS UNIT: This is helpful for muscle pain and spasm.   Search and Purchase a  TENS 7000 2nd edition at www.tenspros.com. It should be less than $30.     TENS unit instructions: Do not shower or bathe with the unit on Turn the unit off before removing electrodes or batteries If the electrodes lose stickiness add a drop of water to the electrodes after they are disconnected from the unit and place on plastic sheet. If you continued to have difficulty, call the TENS unit company to purchase more electrodes. Do not apply lotion on the skin area prior to use. Make sure the skin is clean and dry as this will help prolong the life of the electrodes. After use, always check skin for unusual red areas, rash or other skin difficulties. If there are any skin problems, does not apply electrodes to the same area. Never remove the electrodes from the unit by pulling the wires. Do not use the TENS unit or electrodes other than as directed. Do not change electrode placement without consultating your therapist or physician. Keep 2 fingers with between each electrode.   The stick ~4 inch dollar store ball for self massage

## 2016-02-18 ENCOUNTER — Ambulatory Visit: Payer: No Typology Code available for payment source | Admitting: Rehabilitation

## 2016-02-18 DIAGNOSIS — R2689 Other abnormalities of gait and mobility: Secondary | ICD-10-CM | POA: Diagnosis not present

## 2016-02-18 DIAGNOSIS — M25552 Pain in left hip: Secondary | ICD-10-CM
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
## 2016-02-23 ENCOUNTER — Ambulatory Visit: Payer: No Typology Code available for payment source | Admitting: Rehabilitative and Restorative Service Providers"

## 2016-02-23 ENCOUNTER — Encounter: Payer: Self-pay | Admitting: Rehabilitative and Restorative Service Providers"

## 2016-02-23 DIAGNOSIS — R2689 Other abnormalities of gait and mobility: Secondary | ICD-10-CM

## 2016-02-23 DIAGNOSIS — R29810 Facial weakness: Secondary | ICD-10-CM

## 2016-02-23 DIAGNOSIS — M25552 Pain in left hip: Secondary | ICD-10-CM
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
## 2016-02-23 NOTE — Patient Instructions (Addendum)
HIP: Hamstrings - Supine   Place strap around foot. Raise leg up, keeping knee straight.  Bend opposite knee to protect back if indicated. Hold 30 seconds. 3 reps per set, 2-3 sets per day     Outer Hip Stretch: Reclined IT Band Stretch (Strap)   Strap around one foot, pull leg across body until you feel a pull or stretch, with shoulders on mat. Hold for 30 seconds. Repeat 3 times each leg. 2-3 times/day. Repeat bringing leg out to side. Hold 30 sec x 3   Piriformis Stretch   Lying on back, pull right knee toward opposite shoulder. Hold 30 seconds. Repeat 3 times. Do 2-3 sessions per day.       Piriformis Stretch, Supine    Lie on back, legs bent, feet flat. Raise left foot and place on right knee, push inside of left thigh down. Hold __30_ seconds.  Repeat _3-5__ times per session. Do __2_ sessions per day. Perform with other leg.

## 2016-02-24 ENCOUNTER — Ambulatory Visit
Admission: RE | Admit: 2016-02-24 | Discharge: 2016-02-24 | Disposition: A | Discharge status: Home / Self Care | Payer: Self-pay | Source: Ambulatory Visit | Attending: Orthopedic Surgery | Admitting: Orthopedic Surgery

## 2016-02-24 DIAGNOSIS — T1490XA Injury, unspecified, initial encounter: Secondary | ICD-10-CM

## 2016-02-24 MED ORDER — METHYLPREDNISOLONE ACETATE 40 MG/ML INJ SUSP (RADIOLOG
120.0000 mg | Freq: Once | INTRAMUSCULAR | Status: AC
  Administered 2016-02-24: 120 mg via INTRA_ARTICULAR

## 2016-02-24 MED ORDER — IOPAMIDOL (ISOVUE-M 200) INJECTION 41%
1.0000 mL | Freq: Once | INTRAMUSCULAR | Status: AC
  Administered 2016-02-24: 1 mL via INTRA_ARTICULAR

## 2016-02-25 ENCOUNTER — Encounter: Payer: Self-pay | Admitting: Physical Therapy

## 2016-02-25 ENCOUNTER — Ambulatory Visit: Payer: No Typology Code available for payment source | Admitting: Physical Therapy

## 2016-02-25 DIAGNOSIS — R2689 Other abnormalities of gait and mobility: Secondary | ICD-10-CM | POA: Diagnosis not present

## 2016-02-25 DIAGNOSIS — M25552 Pain in left hip: Secondary | ICD-10-CM
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
## 2016-03-01 ENCOUNTER — Encounter: Payer: Self-pay | Admitting: Rehabilitative and Restorative Service Providers"

## 2016-03-01 ENCOUNTER — Ambulatory Visit: Payer: No Typology Code available for payment source | Admitting: Rehabilitative and Restorative Service Providers"

## 2016-03-01 DIAGNOSIS — R2689 Other abnormalities of gait and mobility: Secondary | ICD-10-CM

## 2016-03-01 DIAGNOSIS — R29810 Facial weakness: Secondary | ICD-10-CM

## 2016-03-01 DIAGNOSIS — M25552 Pain in left hip: Secondary | ICD-10-CM
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
## 2016-03-01 NOTE — Patient Instructions (Signed)
  Quads / HF, Prone   Lie face down. Grasp one ankle with same-side hand. Use towel if needed to reach. Gently pull foot toward buttock.  Hold 30 seconds. Repeat 3 times per session. Do 2-3 sessions per day.

## 2016-03-03 ENCOUNTER — Ambulatory Visit: Payer: No Typology Code available for payment source | Admitting: Physical Therapy

## 2016-03-03 ENCOUNTER — Encounter: Payer: Self-pay | Admitting: Physical Therapy

## 2016-03-03 DIAGNOSIS — R2689 Other abnormalities of gait and mobility: Secondary | ICD-10-CM | POA: Diagnosis not present

## 2016-03-03 DIAGNOSIS — M25552 Pain in left hip: Secondary | ICD-10-CM
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
## 2016-03-07 ENCOUNTER — Ambulatory Visit: Payer: No Typology Code available for payment source | Admitting: Physical Therapy

## 2016-03-07 DIAGNOSIS — R2689 Other abnormalities of gait and mobility: Secondary | ICD-10-CM | POA: Diagnosis not present

## 2016-03-07 DIAGNOSIS — M25552 Pain in left hip: Secondary | ICD-10-CM
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
## 2016-03-08 ENCOUNTER — Ambulatory Visit: Payer: No Typology Code available for payment source

## 2016-03-10 ENCOUNTER — Encounter: Payer: Self-pay | Admitting: Rehabilitation

## 2016-03-10 ENCOUNTER — Ambulatory Visit: Payer: Self-pay | Attending: Physical Medicine & Rehabilitation | Admitting: Rehabilitation

## 2016-03-10 DIAGNOSIS — M25552 Pain in left hip: Secondary | ICD-10-CM | POA: Insufficient documentation

## 2016-03-10 DIAGNOSIS — R2689 Other abnormalities of gait and mobility: Secondary | ICD-10-CM | POA: Insufficient documentation
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
## 2016-05-03 ENCOUNTER — Encounter: Payer: Self-pay | Attending: Physical Medicine & Rehabilitation | Admitting: Physical Medicine & Rehabilitation

## 2016-05-03 ENCOUNTER — Encounter: Payer: Self-pay | Admitting: Physical Medicine & Rehabilitation

## 2016-05-03 VITALS — BP 152/92 | HR 95

## 2016-05-03 DIAGNOSIS — Z87891 Personal history of nicotine dependence: Secondary | ICD-10-CM | POA: Insufficient documentation

## 2016-05-03 DIAGNOSIS — E669 Obesity, unspecified: Secondary | ICD-10-CM | POA: Insufficient documentation

## 2016-05-03 DIAGNOSIS — E119 Type 2 diabetes mellitus without complications: Secondary | ICD-10-CM | POA: Insufficient documentation

## 2016-05-03 DIAGNOSIS — I10 Essential (primary) hypertension: Secondary | ICD-10-CM | POA: Insufficient documentation

## 2016-05-03 DIAGNOSIS — E785 Hyperlipidemia, unspecified: Secondary | ICD-10-CM | POA: Insufficient documentation

## 2016-05-03 DIAGNOSIS — S32485S Nondisplaced dome fracture of left acetabulum, sequela: Secondary | ICD-10-CM

## 2016-05-03 DIAGNOSIS — M25552 Pain in left hip: Secondary | ICD-10-CM | POA: Insufficient documentation

## 2016-05-03 DIAGNOSIS — K219 Gastro-esophageal reflux disease without esophagitis: Secondary | ICD-10-CM | POA: Insufficient documentation

## 2016-05-03 NOTE — Patient Instructions (Signed)
THINK ABOUT YOUR POSTURE AND MECHANICS WHEN YOU WALK, WORK, STAND, SIT, ETC  YOU NEED TO BETTER SHIFT YOUR WEIGHT TO THE LEFT AND STRAIGHTEN OUT YOUR BACK!!
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
## 2016-06-02 ENCOUNTER — Telehealth: Payer: Self-pay | Admitting: *Deleted

## 2016-06-02 NOTE — Telephone Encounter (Signed)
On 06-02-16 fax medical records to [facility] it was consult note, sim and planning note, end of tx note, follow up note

## 2017-11-27 IMAGING — XA DG FLUORO GUIDE NDL PLC/BX
1 series · 1 of 1 positions shown · IV contrast (isovue)
Comparison: None.

CLINICAL DATA: Posttraumatic arthritis left hip. Pain with walking.

EXAM:
Fluoroscopically guided left hip injection
TECHNIQUE: The overlying skin was prepped with Betadine, draped in the usual
sterile fashion, and infiltrated locally with 1% Lidocaine. A 22
gauge spinal needle was advanced under fluoroscopic observation to
the lateral femoral neck. Confirmatory injection of less than 1 ml
of Isovue 200 demonstrates intra-articular spread without
intravascular component.
120 mg Depo-Medrol and 3 ml 1% lidocaine were then instilled. The
procedure was well tolerated. The patient was observed for a short
period of time then discharged in good condition.
FLUOROSCOPY TIME:  0 minutes 20 seconds. 45.81 micro gray meter
squared

[Series 1: ortho standard · 1 of 1 slices shown]
[im 1/1]
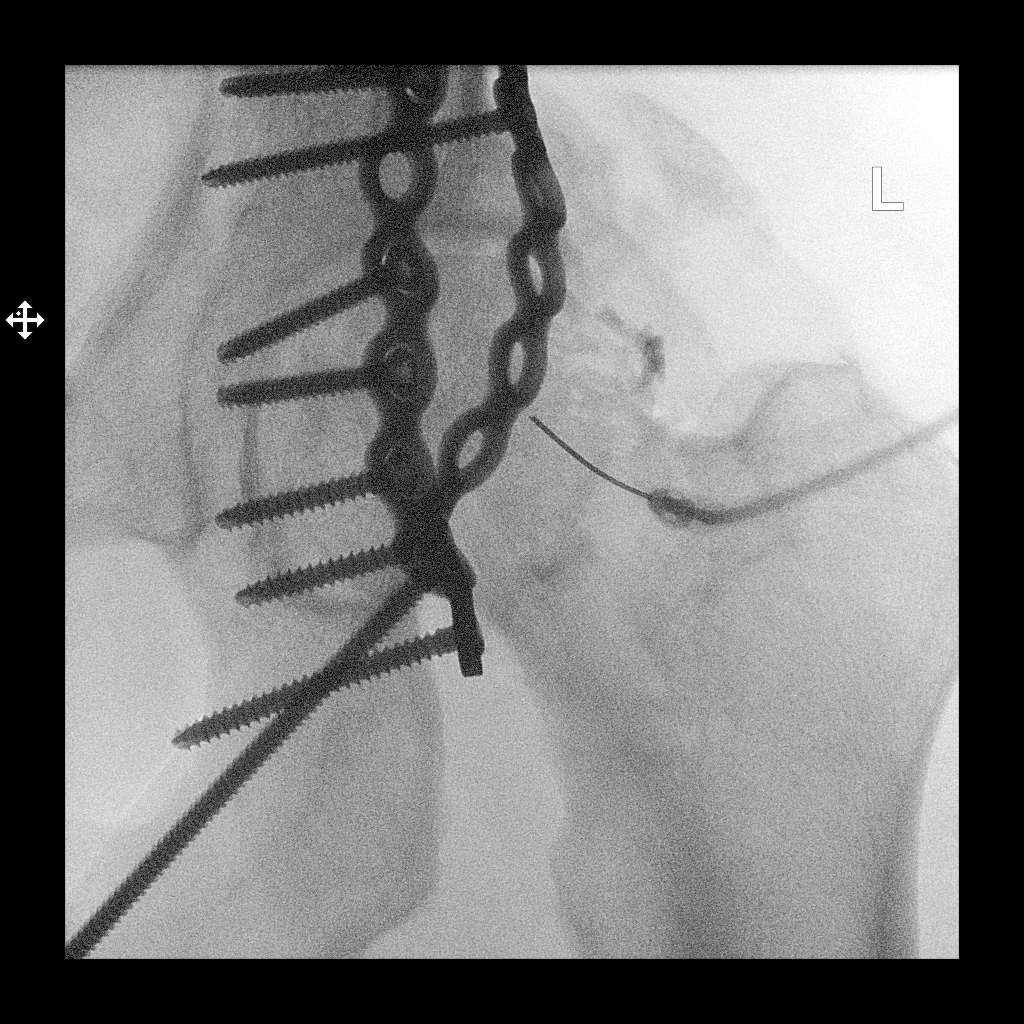

[1 of 1 positions shown; findings below may reference images not displayed]

IMPRESSION: Technically successful left hip injection under fluoroscopy.

## 2018-09-10 DEATH — deceased
